# Patient Record
Sex: Male | Born: 1960
Health system: Southern US, Community
[De-identification: ages and names within clinical notes are randomized; demographics above are authoritative.]

## PROBLEM LIST (undated history)

## (undated) DIAGNOSIS — I4891 Unspecified atrial fibrillation: Secondary | ICD-10-CM

## (undated) DIAGNOSIS — M109 Gout, unspecified: Secondary | ICD-10-CM

## (undated) DIAGNOSIS — E785 Hyperlipidemia, unspecified: Secondary | ICD-10-CM

## (undated) HISTORY — PX: SEPTOPLASTY: SHX2393

## (undated) HISTORY — PX: INGUINAL HERNIA REPAIR: SUR1180

## (undated) HISTORY — PX: TONSILLECTOMY: SUR1361

## (undated) HISTORY — PX: SHOULDER ARTHROSCOPY: SHX128

---

## 2015-02-10 ENCOUNTER — Encounter (HOSPITAL_BASED_OUTPATIENT_CLINIC_OR_DEPARTMENT_OTHER): Payer: Self-pay | Admitting: *Deleted

## 2015-02-10 ENCOUNTER — Emergency Department (HOSPITAL_BASED_OUTPATIENT_CLINIC_OR_DEPARTMENT_OTHER)
Admission: EM | Admit: 2015-02-10 | Discharge: 2015-02-11 | Disposition: A | Discharge status: Home / Self Care | Payer: BLUE CROSS/BLUE SHIELD | Attending: Emergency Medicine | Admitting: Emergency Medicine

## 2015-02-10 DIAGNOSIS — I4891 Unspecified atrial fibrillation: Secondary | ICD-10-CM

## 2015-02-10 DIAGNOSIS — E785 Hyperlipidemia, unspecified: Secondary | ICD-10-CM | POA: Insufficient documentation

## 2015-02-10 DIAGNOSIS — I499 Cardiac arrhythmia, unspecified: Secondary | ICD-10-CM | POA: Diagnosis present

## 2015-02-10 DIAGNOSIS — Z7982 Long term (current) use of aspirin: Secondary | ICD-10-CM | POA: Insufficient documentation

## 2015-02-10 DIAGNOSIS — M109 Gout, unspecified: Secondary | ICD-10-CM | POA: Diagnosis not present

## 2015-02-10 DIAGNOSIS — Z79899 Other long term (current) drug therapy: Secondary | ICD-10-CM | POA: Insufficient documentation

## 2015-02-10 HISTORY — DX: Hyperlipidemia, unspecified: E78.5

## 2015-02-10 HISTORY — DX: Gout, unspecified: M10.9

## 2015-02-10 NOTE — ED Notes (Signed)
Pt states heart beating weird since 4pm. Has had episodes in past and states has never lasted this long. Complaining of dizziness and chest feeling funny but denies pain.

## 2015-02-10 NOTE — ED Notes (Signed)
Pt on heart monitor 

## 2015-02-11 LAB — BASIC METABOLIC PANEL
ANION GAP: 7 (ref 5–15)
BUN: 20 mg/dL (ref 6–20)
CHLORIDE: 106 mmol/L (ref 101–111)
CO2: 26 mmol/L (ref 22–32)
Calcium: 8.8 mg/dL — ABNORMAL LOW (ref 8.9–10.3)
Creatinine, Ser: 0.84 mg/dL (ref 0.61–1.24)
Glucose, Bld: 103 mg/dL — ABNORMAL HIGH (ref 65–99)
POTASSIUM: 3.7 mmol/L (ref 3.5–5.1)
SODIUM: 139 mmol/L (ref 135–145)

## 2015-02-11 LAB — CBC WITH DIFFERENTIAL/PLATELET
BASOS ABS: 0 10*3/uL (ref 0.0–0.1)
Basophils Relative: 0 %
EOS ABS: 0.1 10*3/uL (ref 0.0–0.7)
EOS PCT: 1 %
HCT: 44.9 % (ref 39.0–52.0)
HEMOGLOBIN: 15.6 g/dL (ref 13.0–17.0)
LYMPHS PCT: 27 %
Lymphs Abs: 1.8 10*3/uL (ref 0.7–4.0)
MCH: 31.5 pg (ref 26.0–34.0)
MCHC: 34.7 g/dL (ref 30.0–36.0)
MCV: 90.7 fL (ref 78.0–100.0)
Monocytes Absolute: 0.7 10*3/uL (ref 0.1–1.0)
Monocytes Relative: 11 %
NEUTROS PCT: 61 %
Neutro Abs: 4.2 10*3/uL (ref 1.7–7.7)
PLATELETS: 172 10*3/uL (ref 150–400)
RBC: 4.95 MIL/uL (ref 4.22–5.81)
RDW: 12.2 % (ref 11.5–15.5)
WBC: 6.9 10*3/uL (ref 4.0–10.5)

## 2015-02-11 LAB — TROPONIN I

## 2015-02-11 MED ORDER — APIXABAN 5 MG PO TABS: 5.0000 mg | ORAL_TABLET | Freq: Two times a day (BID) | ORAL | Status: DC

## 2015-02-11 NOTE — Discharge Instructions (Signed)

## 2015-02-11 NOTE — ED Provider Notes (Signed)
CSN: [id number]     Arrival date & time 02/10/15  2241 History   First MD Initiated Contact with Patient 02/11/15 0001     Chief Complaint  Patient presents with  . Irregular Heart Beat     (Consider location/radiation/quality/duration/timing/severity/associated sxs/prior Treatment) HPI  This is a 54 year old male who complains of an irregular heartbeat that began abruptly at about 4 PM. He has had episodes of irregular heartbeat in the past which all has resolved on their own. Those episodes stopped after he gave up caffeine. This is the first time he has had an episode that has persisted. He has at times felt like his heart was racing. He has also felt at times that he had several rapid beats in succession. He denies chest pain. He has some mild shortness of breath. He has had some lightheadedness.  Past Medical History  Diagnosis Date  . Hyperlipidemia   . Gout    Past Surgical History  Procedure Laterality Date  . Shoulder arthroscopy     No family history on file. Social History  Substance Use Topics  . Smoking status: Never Smoker   . Smokeless tobacco: None  . Alcohol Use: 1.2 oz/week    2 Glasses of wine per week     Comment: a few times a week    Review of Systems  All other systems reviewed and are negative.   Allergies  Review of patient's allergies indicates no known allergies.  Home Medications   Prior to Admission medications   Medication Sig Start Date End Date Taking? Authorizing Provider  allopurinol (ZYLOPRIM) 100 MG tablet Take 100 mg by mouth daily.   Yes Historical Provider, MD  aspirin 81 MG tablet Take 81 mg by mouth daily.   Yes Historical Provider, MD  atorvastatin (LIPITOR) 40 MG tablet Take 40 mg by mouth daily.   Yes Historical Provider, MD   BP 119/83 mmHg  Pulse 84  Temp(Src) 97.9 F (36.6 C) (Oral)  Resp 18  Ht 6\' 4"  (1.93 m)  Wt 223 lb (101.152 kg)  BMI 27.16 kg/m2   Physical Exam  General: Well-developed, well-nourished male in  no acute distress; appearance consistent with age of record HENT: normocephalic; atraumatic Eyes: pupils equal, round and reactive to light; extraocular muscles intact Neck: supple Heart: Irregularly irregular rhythm; atrial fibrillation on monitor; occasional PACs Lungs: clear to auscultation bilaterally Abdomen: soft; nondistended; nontender; no masses or hepatosplenomegaly; bowel sounds present Extremities: No deformity; full range of motion; pulses normal Neurologic: Awake, alert and oriented; motor function intact in all extremities and symmetric; no facial droop Skin: Warm and dry Psychiatric: Normal mood and affect    ED Course  Procedures (including critical care time)   EKG Interpretation   Date/Time:  Thursday February 10 2015 22:48:38 EST Ventricular Rate:  86 PR Interval:    QRS Duration: 76 QT Interval:  368 QTC Calculation: 440 R Axis:   64 Text Interpretation:  Atrial fibrillation Abnormal ECG No old tracing to  compare Confirmed by Dorothyann Mourer  MD, Jenny Reichmann ([id number]) on 02/11/2015 12:02:30 AM      MDM  Nursing notes and vitals signs, including pulse oximetry, reviewed.  Summary of this visit's results, reviewed by myself:  Labs:  Results for orders placed or performed during the hospital encounter of 02/10/15 (from the past 24 hour(s))  CBC with Differential/Platelet     Status: None   Collection Time: 02/11/15  1:05 AM  Result Value Ref Range   WBC 6.9 4.0 -  10.5 K/uL   RBC 4.95 4.22 - 5.81 MIL/uL   Hemoglobin 15.6 13.0 - 17.0 g/dL   HCT 44.9 39.0 - 52.0 %   MCV 90.7 78.0 - 100.0 fL   MCH 31.5 26.0 - 34.0 pg   MCHC 34.7 30.0 - 36.0 g/dL   RDW 12.2 11.5 - 15.5 %   Platelets 172 150 - 400 K/uL   Neutrophils Relative % 61 %   Neutro Abs 4.2 1.7 - 7.7 K/uL   Lymphocytes Relative 27 %   Lymphs Abs 1.8 0.7 - 4.0 K/uL   Monocytes Relative 11 %   Monocytes Absolute 0.7 0.1 - 1.0 K/uL   Eosinophils Relative 1 %   Eosinophils Absolute 0.1 0.0 - 0.7 K/uL    Basophils Relative 0 %   Basophils Absolute 0.0 0.0 - 0.1 K/uL  Basic metabolic panel     Status: Abnormal   Collection Time: 02/11/15  1:05 AM  Result Value Ref Range   Sodium 139 135 - 145 mmol/L   Potassium 3.7 3.5 - 5.1 mmol/L   Chloride 106 101 - 111 mmol/L   CO2 26 22 - 32 mmol/L   Glucose, Bld 103 (H) 65 - 99 mg/dL   BUN 20 6 - 20 mg/dL   Creatinine, Ser 0.84 0.61 - 1.24 mg/dL   Calcium 8.8 (L) 8.9 - 10.3 mg/dL   GFR calc non Af Amer >60 >60 mL/min   GFR calc Af Amer >60 >60 mL/min   Anion gap 7 5 - 15  Troponin I     Status: None   Collection Time: 02/11/15  1:05 AM  Result Value Ref Range   Troponin I <0.03 <0.031 ng/mL   1:59 AM Patient continues to be rate controlled. His care was discussed with Dr. Philbert Riser of cardiology. We will start him on Eliquis 5 milligrams twice daily and have him follow-up with cardiology as an outpatient.  Shanon Rosser, MD 02/11/15 [id number]

## 2015-02-11 NOTE — ED Notes (Signed)
Patient is alert and oriented x3.  He was given DC instructions and follow up visit instructions.  Patient gave verbal understanding.  He was DC ambulatory under his own power to home.  V/S stable.  He was not showing any signs of distress on DC 

## 2015-02-15 ENCOUNTER — Ambulatory Visit (INDEPENDENT_AMBULATORY_CARE_PROVIDER_SITE_OTHER): Payer: BLUE CROSS/BLUE SHIELD

## 2015-02-15 ENCOUNTER — Ambulatory Visit (INDEPENDENT_AMBULATORY_CARE_PROVIDER_SITE_OTHER): Payer: BLUE CROSS/BLUE SHIELD | Admitting: Cardiology

## 2015-02-15 ENCOUNTER — Encounter: Payer: Self-pay | Admitting: Cardiology

## 2015-02-15 VITALS — BP 106/78 | HR 88 | Ht 73.0 in | Wt 230.1 lb

## 2015-02-15 DIAGNOSIS — I4891 Unspecified atrial fibrillation: Secondary | ICD-10-CM | POA: Diagnosis not present

## 2015-02-15 DIAGNOSIS — I480 Paroxysmal atrial fibrillation: Secondary | ICD-10-CM | POA: Insufficient documentation

## 2015-02-15 NOTE — Progress Notes (Signed)
Cardiology Office Note   Date:  02/15/2015   ID:  John Hancock, DOB [birth year], MRN [id number]  PCP:  Haywood Pao, MD  Cardiologist:   Minus Breeding, MD   Chief Complaint  Patient presents with  . Atrial Fibrillation      History of Present Illness: John Hancock is a 54 y.o. male who presents for evaluation of atrial fibrillation. He was in the emergency room on 12/1 with an episode of palpitations and was found to be in atrial fibrillation. He was started on Eliquis.  His rate was reasonably controlled.  He has had palpitations off and on for years and he thinks are similar but has never been found to be fibrillation. He is retiring soon and has been to some retirement parties and was drinking more alcohol the night before and he does typically.  However, since being home he's had some palpitations. He seems to notice this more at night. They seem to be skipping beats. It's not as severe as his fibrillation. He's not having any presyncope or syncope. He denies any chest pressure, neck or arm discomfort. He has no shortness of breath, PND or orthopnea. He's physically active and exercises routinely. He has had negative stress tests over the years with his last stress test 2 years ago during which she was able to exercise for about 12 minutes. With all of this he gets no symptoms.    Past Medical History  Diagnosis Date  . Hyperlipidemia   . Gout     Past Surgical History  Procedure Laterality Date  . Shoulder arthroscopy    . Septoplasty    . Tonsillectomy    . Inguinal hernia repair Bilateral      Current Outpatient Prescriptions  Medication Sig Dispense Refill  . allopurinol (ZYLOPRIM) 300 MG tablet Take 1 tablet by mouth daily. Take 1 tab daily    . amoxicillin-clavulanate (AUGMENTIN) 875-125 MG tablet Take 1 tablet by mouth 2 (two) times daily. Take 1 tab twice a day    . aspirin 81 MG tablet Take 81 mg by mouth daily.    Marland Kitchen atorvastatin (LIPITOR) 10 MG tablet  Take 1 tablet by mouth daily. Take 1 tab daily    . benzonatate (TESSALON) 100 MG capsule Take 1 capsule by mouth 3 (three) times daily. Take 1 perle tid    . Glucosamine-Chondroit-Vit C-Mn (GLUCOSAMINE CHONDR 1500 COMPLX) CAPS Take 1 capsule by mouth daily.    . Probiotic Product (PROBIOTIC DAILY PO) Take 1 tablet by mouth daily.     No current facility-administered medications for this visit.    Allergies:   Review of patient's allergies indicates no known allergies.    Social History:  The patient  reports that he has never smoked. He has never used smokeless tobacco. He reports that he drinks about 1.8 oz of alcohol per week. He reports that he does not use illicit drugs.   Family History:  The patient's family history includes CAD (age of onset: 79) in his father; Diabetes in his father, maternal grandfather, maternal grandmother, paternal grandfather, and paternal grandmother; Fainting in his maternal grandfather and paternal grandmother; Heart attack in his maternal grandfather and paternal grandfather; Hypertension in his brother, father, maternal grandfather, maternal grandmother, mother, paternal grandfather, and paternal grandmother.    ROS:  Please see the history of present illness.   Otherwise, review of systems are positive for none.   All other systems are reviewed and negative.    PHYSICAL  EXAM: VS:  BP 106/78 mmHg  Pulse 88  Ht 6\' 1"  (1.854 m)  Wt 230 lb 1 oz (104.356 kg)  BMI 30.36 kg/m2 , BMI Body mass index is 30.36 kg/(m^2). GENERAL:  Well appearing HEENT:  Pupils equal round and reactive, fundi not visualized, oral mucosa unremarkable NECK:  No jugular venous distention, waveform within normal limits, carotid upstroke brisk and symmetric, no bruits, no thyromegaly LYMPHATICS:  No cervical, inguinal adenopathy LUNGS:  Clear to auscultation bilaterally BACK:  No CVA tenderness CHEST:  Unremarkable HEART:  PMI not displaced or sustained,S1 and S2 within normal  limits, no S3, no S4, no clicks, no rubs, no murmurs ABD:  Flat, positive bowel sounds normal in frequency in pitch, no bruits, no rebound, no guarding, no midline pulsatile mass, no hepatomegaly, no splenomegaly EXT:  2 plus pulses throughout, no edema, no cyanosis no clubbing SKIN:  No rashes no nodules NEURO:  Cranial nerves II through XII grossly intact, motor grossly intact throughout PSYCH:  Cognitively intact, oriented to person place and time    EKG:  EKG is ordered today. The ekg ordered today demonstrates Sinus rhythm, rate 87, axis within normal limits, intervals within normal limits, no acute ST-T wave changes.   Recent Labs: 02/11/2015: BUN 20; Creatinine, Ser 0.84; Hemoglobin 15.6; Platelets 172; Potassium 3.7; Sodium 139    Lipid Panel No results found for: CHOL, TRIG, HDL, CHOLHDL, VLDL, LDLCALC, LDLDIRECT    Wt Readings from Last 3 Encounters:  02/15/15 230 lb 1 oz (104.356 kg)  02/10/15 223 lb (101.152 kg)      Other studies Reviewed: Additional studies/ records that were reviewed today include: ED records. Review of the above records demonstrates:  Please see elsewhere in the note.     ASSESSMENT AND PLAN:  ATRIAL FIB:  The patient is currently in sinus rhythm. He's having palpitations that may or may not be atrial fibrillation. I'm going check a TSH and an echocardiogram. I'm going to get an event monitor. Kielan Fisk will need a 21 day event monitor.  The patients symptoms necessitate an event monitor.  The symptoms are too infrequent to be identified on a Holter monitor.  If he does have atrial fibrillation I would likely use flecainide as he symptomatic. However, I suspect he is actually describing premature ectopic complexes. Of note his documented fibrillation could've been related to increased alcohol the day before. Of note his Mr. Fawkes Pivarnik has a CHA2DS2 - VASc score of 0 and does not need systemic anticoagulation.    Current medicines are reviewed  at length with the patient today.  The patient does not have concerns regarding medicines.  The following changes have been made:  As above  Labs/ tests ordered today include:   Orders Placed This Encounter  Procedures  . TSH  . Cardiac event monitor  . EKG 12-Lead  . ECHOCARDIOGRAM COMPLETE     Disposition:   FU with me after the event monitor.      Signed, Minus Breeding, MD  02/15/2015 5:08 PM    [place]

## 2015-02-15 NOTE — Patient Instructions (Signed)
Your physician has recommended you make the following change in your medication:   STOP ELIQUIS  CONTINUE ASPIRIN [address] [place] physician recommends that you return for lab work in: [place] has recommended that you wear a 21 DAY event monitor. Event monitors are medical devices that record the heart's electrical activity. Doctors most often [place] these monitors to diagnose arrhythmias. Arrhythmias are problems with the speed or rhythm of the heartbeat. The monitor is a small, portable device. You can wear one while you do your normal daily activities. This is usually used to diagnose what is causing palpitations/syncope (passing out).  Your physician has requested that you have an echocardiogram. Echocardiography is a painless test that uses sound waves to create images of your heart. It provides your doctor with information about the size and shape of your heart and how well your heart's chambers and valves are working. This procedure takes approximately one hour. There are no restrictions for this procedure.  Your physician recommends that you schedule a follow-up appointment in: [place] DR. HOCHREIN

## 2015-02-16 ENCOUNTER — Other Ambulatory Visit: Payer: Self-pay

## 2015-02-16 ENCOUNTER — Ambulatory Visit (HOSPITAL_COMMUNITY): Payer: BLUE CROSS/BLUE SHIELD | Attending: Cardiovascular Disease

## 2015-02-16 DIAGNOSIS — I4891 Unspecified atrial fibrillation: Secondary | ICD-10-CM | POA: Insufficient documentation

## 2015-02-16 LAB — TSH: TSH: 1.1 u[IU]/mL (ref 0.350–4.500)

## 2015-02-17 ENCOUNTER — Telehealth: Payer: Self-pay | Admitting: Cardiology

## 2015-02-17 NOTE — Telephone Encounter (Signed)
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  Fax incoming

## 2015-02-18 ENCOUNTER — Ambulatory Visit: Payer: BLUE CROSS/BLUE SHIELD | Admitting: Cardiology

## 2015-02-18 NOTE — Telephone Encounter (Signed)
Fax was not received. Discussed w/ Dr. Percival Spanish yesterday, strip printed from [place] website. No concerns. Pt on anticoagulation and recently seen for dx of A Fib out of ER.

## 2015-02-19 ENCOUNTER — Telehealth: Payer: Self-pay | Admitting: Cardiology

## 2015-02-19 NOTE — Telephone Encounter (Signed)
The patient did have atrial fib noted on his monitor.  I would like for him to come in for a POET (Plain Old Exercise Treadmill).  I will need to discuss with him a Flecainide pill in pocket approach after he has the POET (Plain Old Exercise Treadmill).

## 2015-02-21 ENCOUNTER — Telehealth: Payer: Self-pay | Admitting: *Deleted

## 2015-02-21 DIAGNOSIS — I4891 Unspecified atrial fibrillation: Secondary | ICD-10-CM

## 2015-02-21 NOTE — Telephone Encounter (Signed)
-----   Message from Minus Breeding, MD sent at 02/19/2015  1:48 AM EST ----- Please call him and arrange a POET (Plain Old Exercise Treadmill).  He had atrial fib.  I might be starting Flecainide.

## 2015-02-22 ENCOUNTER — Telehealth: Payer: Self-pay | Admitting: Cardiology

## 2015-02-22 NOTE — Telephone Encounter (Signed)
Returning your call from Monday.Please call on Wednesday after 10:00.

## 2015-02-28 ENCOUNTER — Encounter: Payer: Self-pay | Admitting: Cardiology

## 2015-03-31 ENCOUNTER — Telehealth (HOSPITAL_COMMUNITY): Payer: Self-pay

## 2015-03-31 NOTE — Telephone Encounter (Signed)
Encounter complete. 

## 2015-04-04 ENCOUNTER — Ambulatory Visit: Payer: BLUE CROSS/BLUE SHIELD | Admitting: Cardiology

## 2015-04-05 ENCOUNTER — Ambulatory Visit (HOSPITAL_COMMUNITY)
Admission: RE | Admit: 2015-04-05 | Discharge: 2015-04-05 | Disposition: A | Discharge status: Home / Self Care | Payer: BLUE CROSS/BLUE SHIELD | Source: Ambulatory Visit | Attending: Cardiology | Admitting: Cardiology

## 2015-04-05 ENCOUNTER — Encounter: Payer: Self-pay | Admitting: Cardiology

## 2015-04-05 ENCOUNTER — Ambulatory Visit (INDEPENDENT_AMBULATORY_CARE_PROVIDER_SITE_OTHER): Payer: BLUE CROSS/BLUE SHIELD | Admitting: Cardiology

## 2015-04-05 VITALS — BP 100/78 | HR 78 | Ht 76.0 in | Wt 223.3 lb

## 2015-04-05 DIAGNOSIS — I4891 Unspecified atrial fibrillation: Secondary | ICD-10-CM | POA: Insufficient documentation

## 2015-04-05 DIAGNOSIS — I480 Paroxysmal atrial fibrillation: Secondary | ICD-10-CM

## 2015-04-05 LAB — EXERCISE TOLERANCE TEST
CHL CUP MPHR: 139 {beats}/min
CHL CUP RESTING HR STRESS: 64 {beats}/min
CSEPED: 14 min
CSEPHR: 128 %
Estimated workload: 17.3 METS
Exercise duration (sec): 15 s
Peak HR: 210 {beats}/min
RPE: 17

## 2015-04-05 MED ORDER — METOPROLOL SUCCINATE ER 25 MG PO TB24: 25.0000 mg | ORAL_TABLET | Freq: Every day | ORAL | Status: DC

## 2015-04-05 MED ORDER — FLECAINIDE ACETATE 100 MG PO TABS: 100.0000 mg | ORAL_TABLET | Freq: Two times a day (BID) | ORAL | Status: DC

## 2015-04-05 NOTE — Patient Instructions (Signed)
Dr Percival Spanish has recommended making the following medication changes: START Metoprolol Succinate (ToprolXL) 25 mg TODAY - take 1 tablet by mouth daily IN 3 DAYS (FRIDAY) START Flecainide 100 mg - For 3 days, take 0.5 tablet (50 mg total) by mouth twice daily, then increase to 1 tablet (100 mg total) by mouth twice daily.  >New prescriptions have been sent to the pharmacy on file electronically  Your physician recommends that you schedule a follow-up appointment in 1 month.  If you need a refill on your cardiac medications before your next appointment, please call your pharmacy.

## 2015-04-05 NOTE — Progress Notes (Signed)
Cardiology Office Note   Date:  04/05/2015   ID:  John Hancock, DOB [birth year], MRN [id number]  PCP:  Haywood Pao, MD  Cardiologist:   Minus Breeding, MD   Chief Complaint  Patient presents with  . Atrial Fibrillation      History of Present Illness: John Hancock is a 55 y.o. male who presents for evaluation of atrial fibrillation. He was in the emergency room on 12/1 with an episode of palpitations and was found to be in atrial fibrillation. He was started on Eliquis.  His rate was reasonably controlled.  He has had palpitations off and on for years.   I did put an event monitor on him and he did have paroxymal atrial fibrillation. Today he returns for a POET (Plain Old Exercise Treadmill)  which did demonstrate atrial fibrillation while exercising.  He went back into sinus rhythm in recovery.  There was no evidence of ischemia. He does notice some palpitations. He thinks that he notices when he is in fib and he has been doing this paroxysmally.  He otherwise denies any symptoms.  The patient denies any  chest discomfort, neck or arm discomfort. There has been no new shortness of breath, PND or orthopnea. There has been no presyncope or syncope.  Past Medical History  Diagnosis Date  . Hyperlipidemia   . Gout     Past Surgical History  Procedure Laterality Date  . Shoulder arthroscopy    . Septoplasty    . Tonsillectomy    . Inguinal hernia repair Bilateral      Current Outpatient Prescriptions  Medication Sig Dispense Refill  . allopurinol (ZYLOPRIM) 300 MG tablet Take 1 tablet by mouth daily. Take 1 tab daily    . aspirin 81 MG tablet Take 81 mg by mouth daily.    Marland Kitchen atorvastatin (LIPITOR) 10 MG tablet Take 1 tablet by mouth daily. Take 1 tab daily    . Glucosamine-Chondroit-Vit C-Mn (GLUCOSAMINE CHONDR 1500 COMPLX) CAPS Take 1 capsule by mouth daily.    . Probiotic Product (PROBIOTIC DAILY PO) Take 1 tablet by mouth daily.    . flecainide (TAMBOCOR) 100 MG  tablet Take 1 tablet (100 mg total) by mouth 2 (two) times daily. 60 tablet 11  . metoprolol succinate (TOPROL-XL) 25 MG 24 hr tablet Take 1 tablet (25 mg total) by mouth daily. 30 tablet 11   No current facility-administered medications for this visit.    Allergies:   Review of patient's allergies indicates no known allergies.       ROS:  Please see the history of present illness.   Otherwise, review of systems are positive for none.   All other systems are reviewed and negative.    PHYSICAL EXAM: VS:  BP 100/78 mmHg  Pulse 78  Ht 6\' 4"  (1.93 m)  Wt 223 lb 4.8 oz (101.288 kg)  BMI 27.19 kg/m2 , BMI Body mass index is 27.19 kg/(m^2). GENERAL:  Well appearing NECK:  No jugular venous distention, waveform within normal limits, carotid upstroke brisk and symmetric, no bruits, no thyromegaly LYMPHATICS:  No cervical, inguinal adenopathy LUNGS:  Clear to auscultation bilaterally BACK:  No CVA tenderness CHEST:  Unremarkable HEART:  PMI not displaced or sustained,S1 and S2 within normal limits, no S3, no S4, no clicks, no rubs, no murmurs ABD:  Flat, positive bowel sounds normal in frequency in pitch, no bruits, no rebound, no guarding, no midline pulsatile mass, no hepatomegaly, no splenomegaly EXT:  2 plus pulses  throughout, no edema, no cyanosis no clubbing SKIN:  No rashes no nodules    EKG:  EKG is not ordered today.  Recent Labs: 02/11/2015: BUN 20; Creatinine, Ser 0.84; Hemoglobin 15.6; Platelets 172; Potassium 3.7; Sodium 139 02/15/2015: TSH 1.100    Lipid Panel No results found for: CHOL, TRIG, HDL, CHOLHDL, VLDL, LDLCALC, LDLDIRECT    Wt Readings from Last 3 Encounters:  04/05/15 223 lb 4.8 oz (101.288 kg)  02/15/15 230 lb 1 oz (104.356 kg)  02/10/15 223 lb (101.152 kg)      Other studies Reviewed: Additional studies/ records that were reviewed today include:   POET (Plain Old Exercise Treadmill) Review of the above records demonstrates:  Please see elsewhere  in the note.     ASSESSMENT AND PLAN:  ATRIAL FIB:  The patient is having symptomatic PAF.  I will start a low dose of beta blocker and then titrate up flecainide to 100 mg bid.  He will come back in one month and if he is having fewer paroxysms I will plan to get a drug level and repeat a POET (Plain Old Exercise Treadmill) to make sure that there is no pro arrhythmia.     Current medicines are reviewed at length with the patient today.  The patient does not have concerns regarding medicines.  The following changes have been made:  As above  Labs/ tests ordered today include:   No orders of the defined types were placed in this encounter.     Disposition:   FU with me in one month.      Signed, Minus Breeding, MD  04/05/2015 1:39 PM    [facility]

## 2015-04-29 ENCOUNTER — Ambulatory Visit: Payer: BLUE CROSS/BLUE SHIELD | Admitting: Cardiology

## 2015-05-01 NOTE — Progress Notes (Signed)
Cardiology Office Note   Date:  05/02/2015   ID:  John Hancock, DOB 1960-08-13, MRN [id number]  PCP:  Haywood Pao, MD  Cardiologist:   Minus Breeding, MD   No chief complaint on file.     History of Present Illness: John Hancock is a 55 y.o. male who presents for evaluation of atrial fibrillation. He was in the emergency room on 12/1 with an episode of palpitations and was found to be in atrial fibrillation. He was started on Eliquis.  His rate was reasonably controlled.  He has had palpitations off and on for years.   I did put an event monitor on him and he did have paroxymal atrial fibrillation. A POET (Plain Old Exercise Treadmill)  did demonstrate atrial fibrillation while exercising.  He went back into sinus rhythm in recovery.  There was no evidence of ischemia.   He was started on flecainide.  Since then he has had no further palpitations.  He exercises and does not have any symptoms.  The patient denies any new symptoms such as chest discomfort, neck or arm discomfort. There has been no new shortness of breath, PND or orthopnea. There have been no reported palpitations, presyncope or syncope.   Past Medical History  Diagnosis Date  . Hyperlipidemia   . Gout     Past Surgical History  Procedure Laterality Date  . Shoulder arthroscopy    . Septoplasty    . Tonsillectomy    . Inguinal hernia repair Bilateral      Current Outpatient Prescriptions  Medication Sig Dispense Refill  . allopurinol (ZYLOPRIM) 300 MG tablet Take 1 tablet by mouth daily. Take 1 tab daily    . aspirin 81 MG tablet Take 81 mg by mouth daily.    Marland Kitchen atorvastatin (LIPITOR) 10 MG tablet Take 1 tablet by mouth daily. Take 1 tab daily    . flecainide (TAMBOCOR) 100 MG tablet Take 1 tablet (100 mg total) by mouth 2 (two) times daily. 60 tablet 11  . Glucosamine-Chondroit-Vit C-Mn (GLUCOSAMINE CHONDR 1500 COMPLX) CAPS Take 1 capsule by mouth daily.    . metoprolol succinate (TOPROL-XL) 25 MG 24  hr tablet Take 1 tablet (25 mg total) by mouth daily. 30 tablet 11  . Probiotic Product (PROBIOTIC DAILY PO) Take 1 tablet by mouth daily.     No current facility-administered medications for this visit.    Allergies:   Review of patient's allergies indicates no known allergies.       ROS:  Please see the history of present illness.   Otherwise, review of systems are positive for none.   All other systems are reviewed and negative.    PHYSICAL EXAM: VS:  BP 104/80 mmHg  Pulse 76  Ht 6\' 4"  (1.93 m)  Wt 230 lb 4 oz (104.441 kg)  BMI 28.04 kg/m2 , BMI Body mass index is 28.04 kg/(m^2). GENERAL:  Well appearing NECK:  No jugular venous distention, waveform within normal limits, carotid upstroke brisk and symmetric, no bruits, no thyromegaly LYMPHATICS:  No cervical, inguinal adenopathy LUNGS:  Clear to auscultation bilaterally BACK:  No CVA tenderness CHEST:  Unremarkable HEART:  PMI not displaced or sustained,S1 and S2 within normal limits, no S3, no S4, no clicks, no rubs, no murmurs ABD:  Flat, positive bowel sounds normal in frequency in pitch, no bruits, no rebound, no guarding, no midline pulsatile mass, no hepatomegaly, no splenomegaly EXT:  2 plus pulses throughout, no edema, no cyanosis no clubbing SKIN:  No rashes no nodules    EKG:  EKG is not ordered today.  Recent Labs: 02/11/2015: BUN 20; Creatinine, Ser 0.84; Hemoglobin 15.6; Platelets 172; Potassium 3.7; Sodium 139 02/15/2015: TSH 1.100    Lipid Panel No results found for: CHOL, TRIG, HDL, CHOLHDL, VLDL, LDLCALC, LDLDIRECT    Wt Readings from Last 3 Encounters:  05/02/15 230 lb 4 oz (104.441 kg)  04/05/15 223 lb 4.8 oz (101.288 kg)  02/15/15 230 lb 1 oz (104.356 kg)      Other studies Reviewed: Additional studies/ records that were reviewed today include:   POET (Plain Old Exercise Treadmill) Review of the above records demonstrates:  Please see elsewhere in the note.     ASSESSMENT AND  PLAN:  ATRIAL FIB:  Mr. [name] has a CHA2DS2 - VASc score of 0.  He will remain on the current meds and come back for a trough flecainide level and POET (Plain Old Exercise Treadmill) on drug  Current medicines are reviewed at length with the patient today.  The patient does not have concerns regarding medicines.  The following changes have been made:  None  Labs/ tests ordered today include:   Orders Placed This Encounter  Procedures  . Flecainide level  . Exercise Tolerance Test     Disposition:   FU with me in one year   Signed, Minus Breeding, MD  05/02/2015 8:23 AM    [place]

## 2015-05-02 ENCOUNTER — Encounter: Payer: Self-pay | Admitting: Cardiology

## 2015-05-02 ENCOUNTER — Ambulatory Visit: Payer: BLUE CROSS/BLUE SHIELD | Admitting: Cardiology

## 2015-05-02 ENCOUNTER — Ambulatory Visit (INDEPENDENT_AMBULATORY_CARE_PROVIDER_SITE_OTHER): Payer: BLUE CROSS/BLUE SHIELD | Admitting: Cardiology

## 2015-05-02 VITALS — BP 104/80 | HR 76 | Ht 76.0 in | Wt 230.2 lb

## 2015-05-02 DIAGNOSIS — I4891 Unspecified atrial fibrillation: Secondary | ICD-10-CM

## 2015-05-02 MED ORDER — METOPROLOL SUCCINATE ER 25 MG PO TB24: 25.0000 mg | ORAL_TABLET | Freq: Every day | ORAL | Status: DC

## 2015-05-02 MED ORDER — FLECAINIDE ACETATE 100 MG PO TABS: 100.0000 mg | ORAL_TABLET | Freq: Two times a day (BID) | ORAL | Status: DC

## 2015-05-02 NOTE — Patient Instructions (Signed)
Medication Instructions:   NO CHANGE  Labwork:  Your physician recommends that you return for lab work PRIOR TO TAKING FLECAINIDE MORNING DOSE  Testing/Procedures:  Your physician has requested that you have an exercise tolerance test. For further information please visit [email or web address]. Please also follow instruction sheet, as given.    Follow-Up:  Your physician wants you to follow-up in: [place] will receive a reminder letter in the mail two months in advance. If you don't receive a letter, please call our office to schedule the follow-up appointment.   If you need a refill on your cardiac medications before your next appointment, please call your pharmacy.    Exercise Stress Electrocardiogram An exercise stress electrocardiogram is a test to check how blood flows to your heart. It is done to find areas of poor blood flow. You will need to walk on a treadmill for this test. The electrocardiogram will record your heartbeat when you are at rest and when you are exercising. BEFORE THE PROCEDURE  Do not have drinks with caffeine or foods with caffeine for 24 hours before the test, or as told by your doctor. This includes coffee, tea (even decaf tea), sodas, chocolate, and cocoa.  Follow your doctor's instructions about eating and drinking before the test.  Ask your doctor what medicines you should or should not take before the test. Take your medicines with water unless told by your doctor not to.  If you use an inhaler, bring it with you to the test.  Bring a snack to eat after the test.  Do not  smoke for 4 hours before the test.  Do not put lotions, powders, creams, or oils on your chest before the test.  Wear comfortable shoes and clothing. PROCEDURE  You will have patches put on your chest. Small areas of your chest may need to be shaved. Wires will be connected to the patches.  Your heart rate will be watched while you are resting and while  you are exercising.  You will walk on the treadmill. The treadmill will slowly get faster to raise your heart rate.  The test will take about 1-2 hours. AFTER THE PROCEDURE  Your heart rate and blood pressure will be watched after the test.  You may return to your normal diet, activities, and medicines or as told by your doctor.   This information is not intended to replace advice given to you by your health care provider. Make sure you discuss any questions you have with your health care provider.   Document Released: 08/15/2007 Document Revised: 03/19/2014 Document Reviewed: 11/03/2012 Elsevier Interactive Patient Education [employer].

## 2015-05-06 ENCOUNTER — Ambulatory Visit: Payer: BLUE CROSS/BLUE SHIELD | Admitting: Cardiology

## 2015-05-27 ENCOUNTER — Telehealth (HOSPITAL_COMMUNITY): Payer: Self-pay

## 2015-05-27 NOTE — Telephone Encounter (Signed)
Encounter complete. 

## 2015-05-31 ENCOUNTER — Other Ambulatory Visit: Payer: Self-pay | Admitting: *Deleted

## 2015-05-31 ENCOUNTER — Ambulatory Visit (HOSPITAL_COMMUNITY)
Admission: RE | Admit: 2015-05-31 | Discharge: 2015-05-31 | Disposition: A | Discharge status: Home / Self Care | Payer: BLUE CROSS/BLUE SHIELD | Source: Ambulatory Visit | Attending: Cardiovascular Disease | Admitting: Cardiovascular Disease

## 2015-05-31 DIAGNOSIS — I4891 Unspecified atrial fibrillation: Secondary | ICD-10-CM | POA: Diagnosis not present

## 2015-05-31 LAB — EXERCISE TOLERANCE TEST
CHL CUP RESTING HR STRESS: 60 {beats}/min
CHL RATE OF PERCEIVED EXERTION: 15
CSEPED: 13 min
Estimated workload: 15.1 METS
MPHR: 163 {beats}/min
Peak HR: 136 {beats}/min
Percent HR: 83 %

## 2015-05-31 MED ORDER — FLECAINIDE ACETATE 100 MG PO TABS: 100.0000 mg | ORAL_TABLET | Freq: Two times a day (BID) | ORAL | Status: DC

## 2015-05-31 MED ORDER — METOPROLOL SUCCINATE ER 25 MG PO TB24: 25.0000 mg | ORAL_TABLET | Freq: Every day | ORAL | Status: DC

## 2015-05-31 NOTE — Telephone Encounter (Signed)
Refills sent to pharmacy following pt walk-in request.

## 2015-06-03 ENCOUNTER — Other Ambulatory Visit: Payer: Self-pay

## 2015-06-03 MED ORDER — METOPROLOL SUCCINATE ER 25 MG PO TB24: 25.0000 mg | ORAL_TABLET | Freq: Every day | ORAL | Status: AC

## 2015-06-06 ENCOUNTER — Other Ambulatory Visit: Payer: Self-pay | Admitting: *Deleted

## 2015-06-07 LAB — FLECAINIDE LEVEL: Flecainide: 0.2 ug/mL (ref 0.20–1.00)

## 2015-06-08 ENCOUNTER — Other Ambulatory Visit: Payer: Self-pay | Admitting: *Deleted

## 2015-06-08 MED ORDER — FLECAINIDE ACETATE 100 MG PO TABS: 100.0000 mg | ORAL_TABLET | Freq: Two times a day (BID) | ORAL | Status: DC

## 2015-06-09 ENCOUNTER — Telehealth: Payer: Self-pay | Admitting: Cardiology

## 2015-06-09 NOTE — Telephone Encounter (Signed)
Left message to call back  

## 2015-06-09 NOTE — Telephone Encounter (Signed)
Returning your call from earlier this week.He thinks it might have been his test results.

## 2015-06-09 NOTE — Telephone Encounter (Signed)
Leave message for pt to call back 

## 2015-06-09 NOTE — Telephone Encounter (Signed)
F/u   Pt returning RN phone call- test results. Please call back and discuss.

## 2015-06-10 ENCOUNTER — Telehealth: Payer: Self-pay | Admitting: *Deleted

## 2015-06-10 NOTE — Telephone Encounter (Signed)
Spoke to patient.  LAB ( FLECAINIDE) Result given . Verbalized understanding

## 2015-06-10 NOTE — Telephone Encounter (Signed)
Spoke to patient.  POET--Result given . Verbalized understanding

## 2015-06-10 NOTE — Telephone Encounter (Signed)
°  Follow Up   Pt returing call regarding test results. Please call.

## 2015-08-03 ENCOUNTER — Encounter (HOSPITAL_COMMUNITY): Payer: Self-pay | Admitting: Nurse Practitioner

## 2015-08-03 ENCOUNTER — Telehealth: Payer: Self-pay | Admitting: Cardiology

## 2015-08-03 ENCOUNTER — Ambulatory Visit (HOSPITAL_COMMUNITY)
Admission: RE | Admit: 2015-08-03 | Discharge: 2015-08-03 | Disposition: A | Discharge status: Home / Self Care | Payer: BLUE CROSS/BLUE SHIELD | Source: Ambulatory Visit | Attending: Nurse Practitioner | Admitting: Nurse Practitioner

## 2015-08-03 VITALS — BP 118/62 | HR 59 | Ht 76.0 in | Wt 226.0 lb

## 2015-08-03 DIAGNOSIS — I480 Paroxysmal atrial fibrillation: Secondary | ICD-10-CM | POA: Diagnosis not present

## 2015-08-03 DIAGNOSIS — I4891 Unspecified atrial fibrillation: Secondary | ICD-10-CM | POA: Insufficient documentation

## 2015-08-03 DIAGNOSIS — R002 Palpitations: Secondary | ICD-10-CM | POA: Diagnosis not present

## 2015-08-03 NOTE — Telephone Encounter (Signed)
Pt states for past 3-4 days he's been having palpitations. Currently on flec and metoprolol.  Pt states he hasn't checked rate but can feel his heart beating irregularly. States it's not fast. He is not having any SOB. States lightheadedness when standing quickly. BP normally runs [id number] systolic, he has not checked at home today but does have BP cuff. In absence of VS readings, I did not advise extra metoprolol at this time.  I spoke to [facility] at [facility] Fib clinic and gave recount of information given by patient. She conferred w/ Butch Penny who requested to see pt in AF clinic today at 3:30pm.  Called pt back and gave him appt information, including driving directions, gate code, and contact info for clinic.

## 2015-08-03 NOTE — Telephone Encounter (Signed)
Patient c/o Palpitations:  High priority if patient c/o lightheadedness and shortness of breath.  1. How long have you been having palpitations? 3DYAS 2. Are you currently experiencing lightheadedness and shortness of breath? John Hancock  3. Have you checked your BP and heart rate? (document readings) NO  4. Are you experiencing any other symptoms? NO

## 2015-08-03 NOTE — Patient Instructions (Signed)
May take extra 1/2 tablet of metoprolol with afib episode if heart rate is greater than 100 as long as blood pressure is greater than 100.  Record blood pressure/heart rate with afib episodes. Bring log with you to return visit.

## 2015-08-04 ENCOUNTER — Encounter (HOSPITAL_COMMUNITY): Payer: Self-pay | Admitting: Nurse Practitioner

## 2015-08-04 NOTE — Progress Notes (Signed)
Patient ID: John Hancock, male   DOB: [birth year], 55 y.o.   MRN: [id number]    Primary Care Physician: Haywood Pao, MD Referring Physician: Dr. [name] is a 55 y.o. male with a h/o afib, new onset 02/2015, although admits to episodes of palpitations for years.. He was seen by Dr. Percival Spanish  and was placed on flecainide 100 mg bid and on f/u 04/2015 was doing well without any further afib. He is being seen in the afib clinic today due to call into [facility] triage nurse, c/o palpitations for several weeks and is being seen in the afib clinic.  He is now in [place]. He reports no significant use of alcohol or caffeine. 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 He denies any snoring history. He is on asa with a chadsvasc score of 0.   Today, he denies symptoms of palpitations, chest pain, shortness of breath, orthopnea, PND, lower extremity edema, dizziness, presyncope, syncope, or neurologic sequela. The patient is tolerating medications without difficulties and is otherwise without complaint today.   Past Medical History  Diagnosis Date  . Hyperlipidemia   . Gout    Past Surgical History  Procedure Laterality Date  . Shoulder arthroscopy    . Septoplasty    . Tonsillectomy    . Inguinal hernia repair Bilateral     Current Outpatient Prescriptions  Medication Sig Dispense Refill  . allopurinol (ZYLOPRIM) 300 MG tablet Take 1 tablet by mouth daily. Take 1 tab daily    . aspirin 81 MG tablet Take 81 mg by mouth daily.    Marland Kitchen atorvastatin (LIPITOR) 10 MG tablet Take 1 tablet by mouth daily. Take 1 tab daily    . flecainide (TAMBOCOR) 100 MG tablet Take 1 tablet (100 mg total) by mouth 2 (two) times daily. 180 tablet 3  . Glucosamine-Chondroit-Vit C-Mn (GLUCOSAMINE CHONDR 1500 COMPLX) CAPS Take 1 capsule by mouth daily.    . metoprolol succinate  (TOPROL-XL) 25 MG 24 hr tablet Take 1 tablet (25 mg total) by mouth daily. 90 tablet 3  . Probiotic Product (PROBIOTIC DAILY PO) Take 1 tablet by mouth daily.     No current facility-administered medications for this encounter.    No Known Allergies  Social History   Social History  . Marital Status: Married    Spouse Name: N/A  . Number of Children: 2  . Years of Education: N/A   Occupational History  . Not on file.   Social History Main Topics  . Smoking status: Never Smoker   . Smokeless tobacco: Never Used  . Alcohol Use: 1.8 oz/week    3 Glasses of wine per week     Comment: a few times a week  . Drug Use: No  . Sexual Activity:    Partners: Female     Comment: married   Other Topics Concern  . Not on file   Social History Narrative   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      Family History  Problem Relation Age of Onset  . Diabetes Father   . Diabetes Maternal Grandmother   . Diabetes Maternal Grandfather   . Diabetes Paternal Grandmother   . Diabetes Paternal Grandfather   . CAD Father 14    CABG, arrhythmia with ablation (no details)   . Heart attack Maternal  Grandfather   . Heart attack Paternal Grandfather   . Hypertension Mother   . Hypertension Father   . Hypertension Brother   . Hypertension Maternal Grandmother   . Hypertension Maternal Grandfather   . Hypertension Paternal Grandmother   . Hypertension Paternal Grandfather   . Fainting Maternal Grandfather   . Fainting Paternal Grandmother     ROS- All systems are reviewed and negative except as per the HPI above  Physical Exam: Filed Vitals:   08/03/15 1524  BP: 118/62  Pulse: 59  Height: 6\' 4"  (1.93 m)  Weight: 226 lb (102.513 kg)    GEN- The patient is well appearing, alert and oriented x 3 today.   Head- normocephalic, atraumatic Eyes-  Sclera clear, conjunctiva pink Ears- hearing intact Oropharynx- clear Neck- supple, no JVP Lymph- no cervical lymphadenopathy Lungs- Clear  to ausculation bilaterally, normal work of breathing Heart- Regular rate and rhythm, no murmurs, rubs or gallops, PMI not laterally displaced GI- soft, NT, ND, + BS Extremities- no clubbing, cyanosis, or edema MS- no significant deformity or atrophy Skin- no rash or lesion Psych- euthymic mood, full affect Neuro- strength and sensation are intact  EKG- Sinus brady at 59 bpm,  Pr int 178 ms, qrs it 88 ms, qtc 415 ms Epic records reviewed Echo 12/16- Left ventricle: The cavity size was normal. Wall thickness was  normal. Systolic function was normal. The estimated ejection  fraction was in the range of 55% to 60%. Wall motion was normal;  there were no regional wall motion abnormalities. Doppler  parameters are consistent with abnormal left ventricular  relaxation (grade 1 diastolic dysfunction). Left atrium 39 mm   Assessment and Plan: 1. PAF Failing flecainide with more afib burden Discussed options with pt either sotalol, tiksoyn , feel that he is too young for amiodarone, and he did not care for the side effect profile. Also discussed that he would be a good candidate for ablation  He has not been monitoring episodes with HR/BP readings and I encouraged him to get a BP cuff and record BP/HR readings with episodes We also discussed that he could take an extra 1/2 tab of metoprolol if BP sys over 100 and HR over 100.  He would like to monitor  it for a while and will bring back in 3 weeks at which time will arrange for a peak flecainide level to see if any room to increase to 150 mg bid and discuss above options again. Will continue asa for chadsvasc score of 0   Vivian Neuwirth C. Alexina Niccoli, [facility] [address] [place], [place] [address] [phone]

## 2015-08-25 ENCOUNTER — Ambulatory Visit (HOSPITAL_COMMUNITY): Payer: BLUE CROSS/BLUE SHIELD | Admitting: Nurse Practitioner

## 2015-11-11 DIAGNOSIS — L03818 Cellulitis of other sites: Secondary | ICD-10-CM | POA: Diagnosis not present

## 2015-11-11 DIAGNOSIS — S90821A Blister (nonthermal), right foot, initial encounter: Secondary | ICD-10-CM | POA: Diagnosis not present

## 2016-02-15 ENCOUNTER — Emergency Department (HOSPITAL_BASED_OUTPATIENT_CLINIC_OR_DEPARTMENT_OTHER)
Admission: EM | Admit: 2016-02-15 | Discharge: 2016-02-15 | Disposition: A | Discharge status: Home / Self Care | Payer: BLUE CROSS/BLUE SHIELD | Attending: Emergency Medicine | Admitting: Emergency Medicine

## 2016-02-15 ENCOUNTER — Encounter (HOSPITAL_BASED_OUTPATIENT_CLINIC_OR_DEPARTMENT_OTHER): Payer: Self-pay | Admitting: Emergency Medicine

## 2016-02-15 DIAGNOSIS — S0003XA Contusion of scalp, initial encounter: Secondary | ICD-10-CM

## 2016-02-15 DIAGNOSIS — S0990XA Unspecified injury of head, initial encounter: Secondary | ICD-10-CM | POA: Diagnosis present

## 2016-02-15 DIAGNOSIS — Y9389 Activity, other specified: Secondary | ICD-10-CM | POA: Insufficient documentation

## 2016-02-15 DIAGNOSIS — Y999 Unspecified external cause status: Secondary | ICD-10-CM | POA: Insufficient documentation

## 2016-02-15 DIAGNOSIS — Z7982 Long term (current) use of aspirin: Secondary | ICD-10-CM | POA: Diagnosis not present

## 2016-02-15 DIAGNOSIS — W228XXA Striking against or struck by other objects, initial encounter: Secondary | ICD-10-CM | POA: Insufficient documentation

## 2016-02-15 DIAGNOSIS — Y929 Unspecified place or not applicable: Secondary | ICD-10-CM | POA: Insufficient documentation

## 2016-02-15 DIAGNOSIS — S00412A Abrasion of left ear, initial encounter: Secondary | ICD-10-CM

## 2016-02-15 HISTORY — DX: Unspecified atrial fibrillation: I48.91

## 2016-02-15 NOTE — ED Provider Notes (Signed)
[facility] DEPT [facility] Provider Note   CSN: [id number] Arrival date & time: 02/15/16  1611     History   Chief Complaint Chief Complaint  Patient presents with  . Ear Laceration    HPI John Hancock is a 55 y.o. male.  HPI Patient struck by limb to the left scalp and ear roughly 2 hours prior to presentation. No loss of consciousness. No visual changes. No nausea or vomiting. Sustained mild abrasion to the ear. No active bleeding. No changes in hearing. Denies any neck pain, focal weakness or numbness. No loss of balance. Patient is not taking a blood thinner. Past Medical History:  Diagnosis Date  . Atrial fibrillation ([place])   . Gout   . Hyperlipidemia     Patient Active Problem List   Diagnosis Date Noted  . Atrial fibrillation, new onset ([place]) 02/15/2015    Past Surgical History:  Procedure Laterality Date  . INGUINAL HERNIA REPAIR Bilateral   . SEPTOPLASTY    . SHOULDER ARTHROSCOPY    . TONSILLECTOMY         Home Medications    Prior to Admission medications   Medication Sig Start Date End Date Taking? Authorizing Provider  allopurinol (ZYLOPRIM) 300 MG tablet Take 1 tablet by mouth daily. Take 1 tab daily 02/07/15   Historical Provider, MD  aspirin 81 MG tablet Take 81 mg by mouth daily.    Historical Provider, MD  atorvastatin (LIPITOR) 10 MG tablet Take 1 tablet by mouth daily. Take 1 tab daily 11/29/14   Historical Provider, MD  flecainide (TAMBOCOR) 100 MG tablet Take 1 tablet (100 mg total) by mouth 2 (two) times daily. 06/08/15   Minus Breeding, MD  Glucosamine-Chondroit-Vit C-Mn (GLUCOSAMINE CHONDR 1500 COMPLX) CAPS Take 1 capsule by mouth daily.    Historical Provider, MD  metoprolol succinate (TOPROL-XL) 25 MG 24 hr tablet Take 1 tablet (25 mg total) by mouth daily. 06/03/15   Minus Breeding, MD  Probiotic Product (PROBIOTIC DAILY PO) Take 1 tablet by mouth daily.    Historical Provider, MD    Family History Family History  Problem Relation Age  of Onset  . Hypertension Mother   . Diabetes Father   . CAD Father 63    CABG, arrhythmia with ablation (no details)   . Hypertension Father   . Diabetes Maternal Grandmother   . Hypertension Maternal Grandmother   . Diabetes Maternal Grandfather   . Heart attack Maternal Grandfather   . Hypertension Maternal Grandfather   . Fainting Maternal Grandfather   . Diabetes Paternal Grandmother   . Hypertension Paternal Grandmother   . Fainting Paternal Grandmother   . Diabetes Paternal Grandfather   . Heart attack Paternal Grandfather   . Hypertension Paternal Grandfather   . Hypertension Brother     Social History Social History  Substance Use Topics  . Smoking status: Never Smoker  . Smokeless tobacco: Never Used  . Alcohol use 1.8 oz/week    3 Glasses of wine per week     Comment: a few times a week     Allergies   Patient has no known allergies.   Review of Systems Review of Systems  HENT: Negative for facial swelling and hearing loss.   Eyes: Negative for visual disturbance.  Gastrointestinal: Negative for nausea and vomiting.  Musculoskeletal: Negative for back pain, gait problem and neck pain.  Skin: Positive for wound. Negative for rash.  Neurological: Negative for syncope, weakness, numbness and headaches.  Psychiatric/Behavioral: Negative for confusion  and decreased concentration.  All other systems reviewed and are negative.    Physical Exam Updated Vital Signs BP 137/89   Pulse 71   Temp 97.8 F (36.6 C) (Oral)   Resp 18   Ht 6\' 4"  (1.93 m)   Wt 224 lb (101.6 kg)   SpO2 95%   BMI 27.27 kg/m   Physical Exam  Constitutional: He is oriented to person, place, and time. He appears well-developed and well-nourished. No distress.  HENT:  Head: Normocephalic and atraumatic.  Mouth/Throat: Oropharynx is clear and moist.  Patient has small posterior auricular scalp contusion on the left. No hemotympanum bilaterally. Left ear has a small abrasion over  this superior tip of the pinna. No active bleeding. There is contusion noted to the left ear with mild swelling. No definite drainable hematoma.  Eyes: EOM are normal. Pupils are equal, round, and reactive to light.  Neck: Normal range of motion. Neck supple.  No midline cervical tenderness to palpation.  Cardiovascular: Normal rate and regular rhythm.   Pulmonary/Chest: Effort normal and breath sounds normal.  Abdominal: Soft. Bowel sounds are normal. There is no tenderness. There is no rebound and no guarding.  Musculoskeletal: Normal range of motion. He exhibits no edema or tenderness.  Neurological: He is alert and oriented to person, place, and time.  Patient is alert and oriented x3 with clear, goal oriented speech. Patient has 5/5 motor in all extremities. Sensation is intact to light touch. Bilateral finger-to-nose is normal with no signs of dysmetria. Patient has a normal gait and walks without assistance.  Skin: Skin is warm and dry. Capillary refill takes less than 2 seconds. No rash noted. No erythema.  Psychiatric: He has a normal mood and affect. His behavior is normal.  Nursing note and vitals reviewed.    ED Treatments / Results  Labs (all labs ordered are listed, but only abnormal results are displayed) Labs Reviewed - No data to display  EKG  EKG Interpretation None       Radiology No results found.  Procedures Procedures (including critical care time)  Medications Ordered in ED Medications - No data to display   Initial Impression / Assessment and Plan / ED Course  I have reviewed the triage vital signs and the nursing notes.  Pertinent labs & imaging results that were available during my care of the patient were reviewed by me and considered in my medical decision making (see chart for details).  Clinical Course     Patient with ear abrasion and hematoma as well as scalp hematoma. No evidence of concussion. Patient is not on any blood thinners. Do not  believe that CT is necessary at this point. He's been given head injury precautions. Given developing hematoma, attempted to place bulky dressing in the emergency department the left ear. Patient refused dressing saying he would not wear it. Bacitracin and dressing placed to the abrasion of the ear. Advised follow-up with his primary physician.  Final Clinical Impressions(s) / ED Diagnoses   Final diagnoses:  Ear abrasion, left, initial encounter  Contusion of scalp, initial encounter    New Prescriptions New Prescriptions   No medications on file     Julianne Rice, MD 02/15/16 1723

## 2016-02-15 NOTE — ED Triage Notes (Signed)
Cutting trees and limb hit ear

## 2016-04-11 DIAGNOSIS — R8299 Other abnormal findings in urine: Secondary | ICD-10-CM | POA: Diagnosis not present

## 2016-04-11 DIAGNOSIS — Z125 Encounter for screening for malignant neoplasm of prostate: Secondary | ICD-10-CM | POA: Diagnosis not present

## 2016-04-11 DIAGNOSIS — M109 Gout, unspecified: Secondary | ICD-10-CM | POA: Diagnosis not present

## 2016-04-11 DIAGNOSIS — Z0000 Encounter for general adult medical examination without abnormal findings: Secondary | ICD-10-CM | POA: Diagnosis not present

## 2016-04-18 DIAGNOSIS — Z0000 Encounter for general adult medical examination without abnormal findings: Secondary | ICD-10-CM | POA: Diagnosis not present

## 2016-04-18 DIAGNOSIS — M545 Low back pain: Secondary | ICD-10-CM | POA: Diagnosis not present

## 2016-04-18 DIAGNOSIS — Z7901 Long term (current) use of anticoagulants: Secondary | ICD-10-CM | POA: Diagnosis not present

## 2016-04-18 DIAGNOSIS — I519 Heart disease, unspecified: Secondary | ICD-10-CM | POA: Diagnosis not present

## 2016-04-18 DIAGNOSIS — I480 Paroxysmal atrial fibrillation: Secondary | ICD-10-CM | POA: Diagnosis not present

## 2016-04-25 DIAGNOSIS — Z1212 Encounter for screening for malignant neoplasm of rectum: Secondary | ICD-10-CM | POA: Diagnosis not present

## 2016-05-23 ENCOUNTER — Telehealth: Payer: Self-pay | Admitting: Internal Medicine

## 2016-05-28 NOTE — Telephone Encounter (Signed)
Dr.Pyrtle reviewed records and accepted patient. Per Dr.Pyrtle pt due for repeat surveillance in May 2019. Recall will be put in the system. Left patient message to call back and discuss.

## 2016-05-29 DIAGNOSIS — L570 Actinic keratosis: Secondary | ICD-10-CM | POA: Diagnosis not present

## 2016-05-29 DIAGNOSIS — L821 Other seborrheic keratosis: Secondary | ICD-10-CM | POA: Diagnosis not present

## 2016-05-29 DIAGNOSIS — D225 Melanocytic nevi of trunk: Secondary | ICD-10-CM | POA: Diagnosis not present

## 2016-07-12 ENCOUNTER — Other Ambulatory Visit: Payer: Self-pay | Admitting: Cardiology

## 2016-08-12 ENCOUNTER — Other Ambulatory Visit: Payer: Self-pay | Admitting: Cardiology

## 2016-08-13 NOTE — Telephone Encounter (Signed)
REFILL 

## 2016-09-12 ENCOUNTER — Other Ambulatory Visit: Payer: Self-pay | Admitting: Cardiology

## 2016-09-13 ENCOUNTER — Telehealth: Payer: Self-pay | Admitting: Cardiology

## 2016-09-13 ENCOUNTER — Other Ambulatory Visit: Payer: Self-pay | Admitting: Cardiology

## 2016-09-13 MED ORDER — FLECAINIDE ACETATE 100 MG PO TABS: 100.0000 mg | ORAL_TABLET | Freq: Two times a day (BID) | ORAL | 0 refills | Status: DC

## 2016-09-13 NOTE — Telephone Encounter (Signed)
New message     *STAT* If patient is at the pharmacy, call can be transferred to refill team.   1. Which medications need to be refilled? (please list name of each medication and dose if known) flecainide (TAMBOCOR) 100 MG tablet  2. Which pharmacy/location (including street and city if local pharmacy) is medication to be sent to? [facility] on [address]  3. Do they need a 30 day or 90 day supply? 90 day supply

## 2016-09-13 NOTE — Telephone Encounter (Signed)
Rx(s) sent to pharmacy electronically.  

## 2016-09-13 NOTE — Telephone Encounter (Signed)
This refill is declined. Prescription sent earlier in the day for refill for one month. Patient needs an appointment. Last office visit was  early 2017

## 2016-10-10 ENCOUNTER — Other Ambulatory Visit: Payer: Self-pay | Admitting: Cardiology

## 2016-10-23 ENCOUNTER — Other Ambulatory Visit: Payer: Self-pay | Admitting: Cardiology

## 2016-11-16 ENCOUNTER — Other Ambulatory Visit: Payer: Self-pay | Admitting: Cardiology

## 2016-11-16 DIAGNOSIS — L820 Inflamed seborrheic keratosis: Secondary | ICD-10-CM | POA: Diagnosis not present

## 2016-11-16 DIAGNOSIS — D485 Neoplasm of uncertain behavior of skin: Secondary | ICD-10-CM | POA: Diagnosis not present

## 2017-04-22 DIAGNOSIS — Z125 Encounter for screening for malignant neoplasm of prostate: Secondary | ICD-10-CM | POA: Diagnosis not present

## 2017-04-22 DIAGNOSIS — M109 Gout, unspecified: Secondary | ICD-10-CM | POA: Diagnosis not present

## 2017-04-22 DIAGNOSIS — Z0000 Encounter for general adult medical examination without abnormal findings: Secondary | ICD-10-CM | POA: Diagnosis not present

## 2017-04-29 DIAGNOSIS — Z7901 Long term (current) use of anticoagulants: Secondary | ICD-10-CM | POA: Diagnosis not present

## 2017-04-29 DIAGNOSIS — M109 Gout, unspecified: Secondary | ICD-10-CM | POA: Diagnosis not present

## 2017-04-29 DIAGNOSIS — E7800 Pure hypercholesterolemia, unspecified: Secondary | ICD-10-CM | POA: Diagnosis not present

## 2017-04-29 DIAGNOSIS — Z1331 Encounter for screening for depression: Secondary | ICD-10-CM | POA: Diagnosis not present

## 2017-04-29 DIAGNOSIS — I480 Paroxysmal atrial fibrillation: Secondary | ICD-10-CM | POA: Diagnosis not present

## 2017-04-29 DIAGNOSIS — Z0000 Encounter for general adult medical examination without abnormal findings: Secondary | ICD-10-CM | POA: Diagnosis not present

## 2017-05-02 DIAGNOSIS — Z1212 Encounter for screening for malignant neoplasm of rectum: Secondary | ICD-10-CM | POA: Diagnosis not present

## 2017-05-08 ENCOUNTER — Emergency Department (HOSPITAL_BASED_OUTPATIENT_CLINIC_OR_DEPARTMENT_OTHER): Payer: BLUE CROSS/BLUE SHIELD

## 2017-05-08 ENCOUNTER — Other Ambulatory Visit: Payer: Self-pay

## 2017-05-08 ENCOUNTER — Encounter (HOSPITAL_BASED_OUTPATIENT_CLINIC_OR_DEPARTMENT_OTHER): Payer: Self-pay | Admitting: Emergency Medicine

## 2017-05-08 ENCOUNTER — Emergency Department (HOSPITAL_BASED_OUTPATIENT_CLINIC_OR_DEPARTMENT_OTHER)
Admission: EM | Admit: 2017-05-08 | Discharge: 2017-05-08 | Disposition: A | Discharge status: Home / Self Care | Payer: BLUE CROSS/BLUE SHIELD | Attending: Emergency Medicine | Admitting: Emergency Medicine

## 2017-05-08 DIAGNOSIS — M25461 Effusion, right knee: Secondary | ICD-10-CM

## 2017-05-08 DIAGNOSIS — M79604 Pain in right leg: Secondary | ICD-10-CM | POA: Diagnosis not present

## 2017-05-08 DIAGNOSIS — M7989 Other specified soft tissue disorders: Secondary | ICD-10-CM | POA: Diagnosis not present

## 2017-05-08 MED ORDER — IBUPROFEN 800 MG PO TABS: 800.0000 mg | ORAL_TABLET | Freq: Three times a day (TID) | ORAL | 0 refills | Status: AC | PRN

## 2017-05-08 MED ORDER — OXYCODONE-ACETAMINOPHEN 5-325 MG PO TABS
1.0000 | ORAL_TABLET | Freq: Once | ORAL | Status: AC
  Administered 2017-05-08: 1 via ORAL
  Filled 2017-05-08: qty 1

## 2017-05-08 MED ORDER — OXYCODONE-ACETAMINOPHEN 5-325 MG PO TABS: 1.0000 | ORAL_TABLET | Freq: Four times a day (QID) | ORAL | 0 refills | Status: DC | PRN

## 2017-05-08 NOTE — ED Notes (Signed)
Right knee is warmer compared to the left knee.  Patient claimed that it hurts.

## 2017-05-08 NOTE — ED Notes (Signed)
ED Provider at bedside. 

## 2017-05-08 NOTE — ED Triage Notes (Signed)
Onset of rt leg swelling and pain x 1 hr ago

## 2017-05-08 NOTE — Discharge Instructions (Signed)
You were seen in the ED today with leg pain. You do not have a blood clot in the leg. Your knee has fluid on it that should resolve with time. Take the medication provided as needed and apply ice for the next 24 hours. Keep the leg elevated. Apply a compression wrap or knee sleeve for comfort.   Return to the ED with any fever, chills, knee redness, or increased warmth.

## 2017-05-08 NOTE — ED Provider Notes (Signed)
Emergency Department Provider Note   I have reviewed the triage vital signs and the nursing notes.   HISTORY  Chief Complaint Leg Pain   HPI John Hancock is a 57 y.o. male with PMH of a-fib, HLD, and gout to the emergency department for evaluation of sudden onset right leg pain focused behind the knee.  He has had some mild right knee swelling in addition to pain.  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  No injury during this activity that he was aware of.  No falls.  He returned home and was resting when he had sudden pain and noticed some swelling in the area.  He is not anticoagulated.  He noticed some swelling around the knee and pain behind the knee.  No fevers or chills.  No associated redness.  No radiation of symptoms or modifying factors.   Past Medical History:  Diagnosis Date  . Atrial fibrillation ([place])   . Gout   . Hyperlipidemia     Patient Active Problem List   Diagnosis Date Noted  . Atrial fibrillation, new onset ([place]) 02/15/2015    Past Surgical History:  Procedure Laterality Date  . INGUINAL HERNIA REPAIR Bilateral   . SEPTOPLASTY    . SHOULDER ARTHROSCOPY    . TONSILLECTOMY      Current Outpatient Rx  . Order #: [id number] Class: Historical Med  . Order #: [id number] Class: Historical Med  . Order #: [id number] Class: Historical Med  . Order #: [id number] Class: Normal  . Order #: [id number] Class: Historical Med  . Order #: [id number] Class: Print  . Order #: [id number] Class: Normal  . Order #: [id number] Class: Print  . Order #: [id number] Class: Historical Med    Allergies Patient has no known allergies.  Family History  Problem Relation Age of Onset  . Hypertension Mother   . Diabetes Father   . CAD Father 39       CABG, arrhythmia with ablation (no details)   . Hypertension Father   . Diabetes Maternal Grandmother   . Hypertension Maternal Grandmother   . Diabetes Maternal Grandfather   . Heart attack Maternal  Grandfather   . Hypertension Maternal Grandfather   . Fainting Maternal Grandfather   . Diabetes Paternal Grandmother   . Hypertension Paternal Grandmother   . Fainting Paternal Grandmother   . Diabetes Paternal Grandfather   . Heart attack Paternal Grandfather   . Hypertension Paternal Grandfather   . Hypertension Brother     Social History Social History   Tobacco Use  . Smoking status: Never Smoker  . Smokeless tobacco: Never Used  Substance Use Topics  . Alcohol use: Yes    Alcohol/week: 1.8 oz    Types: 3 Glasses of wine per week    Comment: a few times a week  . Drug use: No    Review of Systems  Constitutional: No fever/chills Eyes: No visual changes. ENT: No sore throat. Cardiovascular: Denies chest pain. Respiratory: Denies shortness of breath. Gastrointestinal: No abdominal pain.  No nausea, no vomiting.  No diarrhea.  No constipation. Genitourinary: Negative for dysuria. Musculoskeletal: Negative for back pain. Right leg and knee pain with some swelling.  Skin: Negative for rash. Neurological: Negative for headaches, focal weakness or numbness.  10-point ROS otherwise negative.  ____________________________________________   PHYSICAL EXAM:  VITAL SIGNS: ED Triage Vitals  Enc Vitals Group     BP 05/08/17 2024 129/81     Pulse Rate 05/08/17 2024 66  Resp 05/08/17 2024 18     Temp 05/08/17 2024 98.6 F (37 C)     Temp Source 05/08/17 2024 Oral     SpO2 05/08/17 2024 95 %     Weight 05/08/17 2015 225 lb (102.1 kg)     Height 05/08/17 2015 6\' 4"  (1.93 m)     Pain Score 05/08/17 2015 10   Constitutional: Alert and oriented. Well appearing and in no acute distress. Eyes: Conjunctivae are normal. Head: Atraumatic. Nose: No congestion/rhinnorhea. Mouth/Throat: Mucous membranes are moist.  Neck: No stridor.  Cardiovascular: Good peripheral circulation.  Respiratory: Normal respiratory effort.  Gastrointestinal: No distention.  Musculoskeletal:  Moderate right knee effusion without erythema or warmth. Mild posterior knee tenderness. No calf swelling or tenderness. Pain with ROM but patient is ambulatory.  Neurologic:  Normal speech and language. No gross focal neurologic deficits are appreciated.  Skin:  Skin is warm, dry and intact. No rash noted.  ____________________________________________  RADIOLOGY  Dg Knee 2 Views Right  Result Date: 05/08/2017 CLINICAL DATA:  Right knee pain EXAM: RIGHT KNEE - 1-2 VIEW COMPARISON:  None. FINDINGS: Moderate joint effusion. No acute bony abnormality. Specifically, no fracture, subluxation, or dislocation. IMPRESSION: Moderate joint effusion.  No acute bony abnormality. Electronically Signed   By: Rolm Baptise M.D.   On: 05/08/2017 22:25   US Venous Img Lower Right (dvt Study)  Result Date: 05/08/2017 CLINICAL DATA:  Right knee swelling EXAM: RIGHT LOWER EXTREMITY VENOUS DOPPLER ULTRASOUND TECHNIQUE: Gray-scale sonography with graded compression, as well as color Doppler and duplex ultrasound were performed to evaluate the lower extremity deep venous systems from the level of the common femoral vein and including the common femoral, femoral, profunda femoral, popliteal and calf veins including the posterior tibial, peroneal and gastrocnemius veins when visible. The superficial great saphenous vein was also interrogated. Spectral Doppler was utilized to evaluate flow at rest and with distal augmentation maneuvers in the common femoral, femoral and popliteal veins. COMPARISON:  None. FINDINGS: Contralateral Common Femoral Vein: Respiratory phasicity is normal and symmetric with the symptomatic side. No evidence of thrombus. Normal compressibility. Common Femoral Vein: No evidence of thrombus. Normal compressibility, respiratory phasicity and response to augmentation. Saphenofemoral Junction: No evidence of thrombus. Normal compressibility and flow on color Doppler imaging. Profunda Femoral Vein: No evidence  of thrombus. Normal compressibility and flow on color Doppler imaging. Femoral Vein: No evidence of thrombus. Normal compressibility, respiratory phasicity and response to augmentation. Popliteal Vein: No evidence of thrombus. Normal compressibility, respiratory phasicity and response to augmentation. Calf Veins: No evidence of thrombus. Normal compressibility and flow on color Doppler imaging. Superficial Great Saphenous Vein: No evidence of thrombus. Normal compressibility. Venous Reflux:  None. Other Findings:  Right knee joint effusion noted. IMPRESSION: No evidence of deep venous thrombosis. Right knee joint effusion. Electronically Signed   By: Rolm Baptise M.D.   On: 05/08/2017 22:27    ____________________________________________   PROCEDURES  Procedure(s) performed:   Procedures  None ____________________________________________   INITIAL IMPRESSION / ASSESSMENT AND PLAN / ED COURSE  Pertinent labs & imaging results that were available during my care of the patient were reviewed by me and considered in my medical decision making (see chart for details).  Patient presents to the emergency department for evaluation of right knee pain with some swelling.  Most of his pain is posterior.  He does have a small joint effusion.  He has been working today by cutting trees states this is not unusual for him.  Plan for ultrasound of the right lower extremity to rule out DVT.  He is not having chest pain or dyspnea.  Plan for plain film of the knee as well.  [place] and x-ray negative for acute fracture or DVT. No evidence of septic joint. Knee sleeve applied. Plan for motrin and sport med follow up. Discussed symptoms of septic joint and need for return shoulder redness, warmth, or fever develop.   At this time, I do not feel there is any life-threatening condition present. I have reviewed and discussed all results (EKG, imaging, lab, urine as appropriate), exam findings with patient. I have  reviewed nursing notes and appropriate previous records.  I feel the patient is safe to be discharged home without further emergent workup. Discussed usual and customary return precautions. Patient and family (if present) verbalize understanding and are comfortable with this plan.  Patient will follow-up with their primary care provider. If they do not have a primary care provider, information for follow-up has been provided to them. All questions have been answered.  ____________________________________________  FINAL CLINICAL IMPRESSION(S) / ED DIAGNOSES  Final diagnoses:  Right leg pain  Effusion of right knee     MEDICATIONS GIVEN DURING THIS VISIT:  Medications  oxyCODONE-acetaminophen (PERCOCET/ROXICET) 5-325 MG per tablet 1 tablet (1 tablet Oral Given 05/08/17 2128)     NEW OUTPATIENT MEDICATIONS STARTED DURING THIS VISIT:  Discharge Medication List as of 05/08/2017 10:44 PM    START taking these medications   Details  ibuprofen (ADVIL,MOTRIN) 800 MG tablet Take 1 tablet (800 mg total) by mouth every 8 (eight) hours as needed., Starting Wed 05/08/2017, Print    oxyCODONE-acetaminophen (PERCOCET/ROXICET) 5-325 MG tablet Take 1 tablet by mouth every 6 (six) hours as needed for severe pain., Starting Wed 05/08/2017, Print        Note:  This document was prepared using Dragon voice recognition software and may include unintentional dictation errors.  Nanda [facility], MD Emergency Medicine    Raiya Stainback, [facility], MD 05/09/17 [phone]

## 2017-05-16 ENCOUNTER — Ambulatory Visit (INDEPENDENT_AMBULATORY_CARE_PROVIDER_SITE_OTHER): Payer: BLUE CROSS/BLUE SHIELD | Admitting: Family Medicine

## 2017-05-16 ENCOUNTER — Encounter: Payer: Self-pay | Admitting: Family Medicine

## 2017-05-16 DIAGNOSIS — M25461 Effusion, right knee: Secondary | ICD-10-CM

## 2017-05-16 NOTE — Assessment & Plan Note (Signed)
Acute. Likely related to chronic OA. DVT ruled out by U/S. Knee xray c/w mod knee effusion though not weight bearing. Unlikely gouty arthritis given recently controlled uric acid level. On aspirin for a-fib though hemarthrosis less likely. Not c/w septic arthritis.  Effusion spontaneously resolving with conservative therapy. - Advised patient to continue exercising and avoid squats - Continue tylenol and topical pain meds as needed - Reviewed return precautions

## 2017-05-16 NOTE — Patient Instructions (Signed)
Your pain is due to arthritis. These are the different medications you can take for this: Tylenol 500mg  1-2 tabs three times a day for pain. Capsaicin, aspercreme, or biofreeze topically up to four times a day may also help with pain. Some supplements that may help for arthritis: Boswellia extract, curcumin, pycnogenol Aleve 1-2 tabs twice a day with food if needed. Cortisone injections are an option. If cortisone injections do not help, there are different types of shots that may help but they take longer to take effect. It's important that you continue to stay active. Straight leg raises, knee extensions 3 sets of 10 once a day (add ankle weight if these become too easy). Consider physical therapy to strengthen muscles around the joint that hurts to take pressure off of the joint itself. Shoe inserts with good arch support may be helpful. Heat or ice 15 minutes at a time 3-4 times a day as needed to help with pain. Water aerobics and cycling with low resistance are the best two types of exercise for arthritis though any exercise is ok as long as it doesn't worsen the pain. Follow up with me as needed.

## 2017-05-16 NOTE — Progress Notes (Signed)
   Subjective   Patient ID: John Hancock    DOB: February 03, 1961, 57 y.o. male   MRN: [id number]  CC: "Right knee pain"  HPI: John Hancock is a 57 y.o. male who presents to clinic today for the following:  Right knee pain: Patient is an active 57 year old who enjoys running marathons and golfing who presented to the ED 2 weeks ago with sudden onset of right knee swelling and suprapatellar pain after yard work.  DVT was ruled out by ultrasound and knee radiographs showed moderate effusion though imaging was without weight bearing.  He was told to follow up at the sports medicine clinic.  He denies history of trauma, fall, or twisting of the knee.  Patient endorses chronic history of subpatellar aching worse with squats.  He has no history of prior surgeries.  He is currently on aspirin for atrial fibrillation.  He is also on allopurinol for gout but denies history of gouty arthritis of the knee.  The knee edema and pain significantly improved over the course of 36 hours following ice, rest, and elevation.  He has minimal use of Advil.    ROS: see HPI for pertinent.  [place]: Atrial fibrillation, HLD, gout. Surgical history tonsillectomy, shoulder arthroscopy, hernia, septoplasty. Family history HTN, CAD, DM. Smoking status reviewed. Medications reviewed.  Objective   BP 130/84   Ht 6\' 4"  (1.93 m)   Wt 225 lb (102.1 kg)   BMI 27.39 kg/m  Vitals and nursing note reviewed.  General: well nourished, well developed, NAD with non-toxic appearance HEENT: normocephalic, atraumatic, moist mucous membranes Cardiovascular: regular rate and rhythm without murmurs, rubs, or gallops Lungs: clear to auscultation bilaterally with normal work of breathing Skin: warm, dry, no rashes or lesions, cap refill < 2 seconds Extremities: warm and well perfused, normal tone, no edema MSK: right knee slightly large than left, positive fluid wave superior to patella, mild tenderness to lateral suprapatellar region, no  erythema or calor to knee, moderate crepitus with knee extension without tracking of patella, active and passive ROM intact, negative anterior and posterior drawer test, negative valgus and varus stress test, negative McMurray test, neurovascular intact  Assessment & Plan   Effusion of right knee Acute. Likely related to chronic OA. DVT ruled out by U/S. Knee xray c/w mod knee effusion though not weight bearing. Unlikely gouty arthritis given recently controlled uric acid level. On aspirin for a-fib though hemarthrosis less likely. Not c/w septic arthritis.  Effusion spontaneously resolving with conservative therapy. - Advised patient to continue exercising and avoid squats - Continue tylenol and topical pain meds as needed - Reviewed return precautions  No orders of the defined types were placed in this encounter.  No orders of the defined types were placed in this encounter.   John Hancock, [place], PGY-2 05/16/2017, 10:43 AM

## 2017-07-26 ENCOUNTER — Encounter: Payer: Self-pay | Admitting: Internal Medicine

## 2017-09-02 DIAGNOSIS — H1033 Unspecified acute conjunctivitis, bilateral: Secondary | ICD-10-CM | POA: Diagnosis not present

## 2017-09-06 ENCOUNTER — Encounter: Payer: Self-pay | Admitting: Internal Medicine

## 2017-10-29 ENCOUNTER — Ambulatory Visit (AMBULATORY_SURGERY_CENTER): Payer: Self-pay | Admitting: *Deleted

## 2017-10-29 ENCOUNTER — Encounter: Payer: Self-pay | Admitting: Internal Medicine

## 2017-10-29 VITALS — Ht 76.0 in | Wt 234.2 lb

## 2017-10-29 DIAGNOSIS — Z86010 Personal history of colonic polyps: Secondary | ICD-10-CM

## 2017-10-29 MED ORDER — NA SULFATE-K SULFATE-MG SULF 17.5-3.13-1.6 GM/177ML PO SOLN: 1.0000 | Freq: Once | ORAL | 0 refills | Status: AC

## 2017-11-12 ENCOUNTER — Encounter: Payer: Self-pay | Admitting: Internal Medicine

## 2017-11-12 ENCOUNTER — Ambulatory Visit (AMBULATORY_SURGERY_CENTER): Payer: BLUE CROSS/BLUE SHIELD | Admitting: Internal Medicine

## 2017-11-12 VITALS — BP 115/76 | HR 58 | Temp 98.0°F | Resp 25 | Ht 76.0 in | Wt 234.0 lb

## 2017-11-12 DIAGNOSIS — D124 Benign neoplasm of descending colon: Secondary | ICD-10-CM | POA: Diagnosis not present

## 2017-11-12 DIAGNOSIS — D123 Benign neoplasm of transverse colon: Secondary | ICD-10-CM | POA: Diagnosis not present

## 2017-11-12 DIAGNOSIS — Z86010 Personal history of colonic polyps: Secondary | ICD-10-CM

## 2017-11-12 DIAGNOSIS — D120 Benign neoplasm of cecum: Secondary | ICD-10-CM

## 2017-11-12 MED ORDER — SODIUM CHLORIDE 0.9 % IV SOLN: 500.0000 mL | Freq: Once | INTRAVENOUS | Status: DC

## 2017-11-12 NOTE — Progress Notes (Signed)
Pt's states no medical or surgical changes since previsit or office visit. 

## 2017-11-12 NOTE — Progress Notes (Signed)
Report to PACU, RN, vss, BBS= Clear.  

## 2017-11-12 NOTE — Progress Notes (Signed)
No problems noted in the recovery room. maw 

## 2017-11-12 NOTE — Op Note (Signed)
[facility] Patient Name: John Hancock Procedure Date: 11/12/2017 2:13 PM MRN: [id number] Endoscopist: Jerene Bears , MD Age: 57 Referring MD:  Date of Birth: 03-07-1961 Gender: Male Account #: [id number] Procedure:                Colonoscopy Indications:              High risk colon cancer surveillance: Personal                            history of colonic polyps, Last colonoscopy 5 years                            ago Medicines:                Monitored Anesthesia Care Procedure:                Pre-Anesthesia Assessment:                           - Prior to the procedure, a History and Physical                            was performed, and patient medications and                            allergies were reviewed. The patient's tolerance of                            previous anesthesia was also reviewed. The risks                            and benefits of the procedure and the sedation                            options and risks were discussed with the patient.                            All questions were answered, and informed consent                            was obtained. Prior Anticoagulants: The patient has                            taken no previous anticoagulant or antiplatelet                            agents. ASA Grade Assessment: II - A patient with                            mild systemic disease. After reviewing the risks                            and benefits, the patient was deemed in  satisfactory condition to undergo the procedure.                           After obtaining informed consent, the colonoscope                            was passed under direct vision. Throughout the                            procedure, the patient's blood pressure, pulse, and                            oxygen saturations were monitored continuously. The                            Colonoscope was introduced through the anus and               advanced to the cecum, identified by appendiceal                            orifice and ileocecal valve. The colonoscopy was                            performed without difficulty. The patient tolerated                            the procedure well. The quality of the bowel                            preparation was good. The ileocecal valve,                            appendiceal orifice, and rectum were photographed. Scope In: 2:26:39 PM Scope Out: 4:09:81 PM Scope Withdrawal Time: 0 hours 14 minutes 48 seconds  Total Procedure Duration: 0 hours 19 minutes 33 seconds  Findings:                 The digital rectal exam was normal.                           Two sessile polyps were found in the cecum. The                            polyps were 4 to 5 mm in size. These polyps were                            removed with a cold snare. Resection and retrieval                            were complete.                           A 5 mm polyp was found in the transverse colon. The  polyp was sessile. The polyp was removed with a                            cold snare. Resection and retrieval were complete.                           A 5 mm polyp was found in the descending colon. The                            polyp was sessile. The polyp was removed with a                            cold snare. Resection and retrieval were complete.                           Internal hemorrhoids and hypertrophied anal                            papillae were found during retroflexion. The                            hemorrhoids were small. Complications:            No immediate complications. Estimated Blood Loss:     Estimated blood loss was minimal. Impression:               - Two 4 to 5 mm polyps in the cecum, removed with a                            cold snare. Resected and retrieved.                           - One 5 mm polyp in the transverse colon, removed                             with a cold snare. Resected and retrieved.                           - One 5 mm polyp in the descending colon, removed                            with a cold snare. Resected and retrieved.                           - Small internal hemorrhoids. Recommendation:           - Patient has a contact number available for                            emergencies. The signs and symptoms of potential                            delayed complications were discussed with the  patient. Return to normal activities tomorrow.                            Written discharge instructions were provided to the                            patient.                           - Resume previous diet.                           - Continue present medications.                           - Await pathology results.                           - Repeat colonoscopy is recommended. The                            colonoscopy date will be determined after pathology                            results from today's exam become available for                            review. Jerene Bears, MD 11/12/2017 2:51:06 PM This report has been signed electronically.

## 2017-11-12 NOTE — Progress Notes (Signed)
Called to room to assist during endoscopic procedure.  Patient ID and intended procedure confirmed with present staff. Received instructions for my participation in the procedure from the performing physician.  

## 2017-11-12 NOTE — Patient Instructions (Signed)
YOU HAD AN ENDOSCOPIC PROCEDURE TODAY AT THE [facility]:   Refer to the procedure report that was given to you for any specific questions about what was found during the examination.  If the procedure report does not answer your questions, please call your gastroenterologist to clarify.  If you requested that your care partner not be given the details of your procedure findings, then the procedure report has been included in a sealed envelope for you to review at your convenience later.  YOU SHOULD EXPECT: Some feelings of bloating in the abdomen. Passage of more gas than usual.  Walking can help get rid of the air that was put into your GI tract during the procedure and reduce the bloating. If you had a lower endoscopy (such as a colonoscopy or flexible sigmoidoscopy) you may notice spotting of blood in your stool or on the toilet paper. If you underwent a bowel prep for your procedure, you may not have a normal bowel movement for a few days.  Please Note:  You might notice some irritation and congestion in your nose or some drainage.  This is from the oxygen used during your procedure.  There is no need for concern and it should clear up in a day or so.  SYMPTOMS TO REPORT IMMEDIATELY:   Following lower endoscopy (colonoscopy or flexible sigmoidoscopy):  Excessive amounts of blood in the stool  Significant tenderness or worsening of abdominal pains  Swelling of the abdomen that is new, acute  Fever of 100F or higher   For urgent or emergent issues, a gastroenterologist can be reached at any hour by calling [phone].   DIET:  We do recommend a small meal at first, but then you may proceed to your regular diet.  Drink plenty of fluids but you should avoid alcoholic beverages for 24 hours.  ACTIVITY:  You should plan to take it easy for the rest of today and you should NOT DRIVE or use heavy machinery until tomorrow (because of the sedation medicines used during the test).     FOLLOW UP: Our staff will call the number listed on your records the next business day following your procedure to check on you and address any questions or concerns that you may have regarding the information given to you following your procedure. If we do not reach you, we will leave a message.  However, if you are feeling well and you are not experiencing any problems, there is no need to return our call.  We will assume that you have returned to your regular daily activities without incident.  If any biopsies were taken you will be contacted by phone or by letter within the next 1-3 weeks.  Please call us at [phone] if you have not heard about the biopsies in 3 weeks.    SIGNATURES/CONFIDENTIALITY: You and/or your care partner have signed paperwork which will be entered into your electronic medical record.  These signatures attest to the fact that that the information above on your After Visit Summary has been reviewed and is understood.  Full responsibility of the confidentiality of this discharge information lies with you and/or your care-partner.    Handouts were given to your care partner on polyps and hemorrhoids. You may resume your current medications today. Await biopsy results. Please call if any questions or concerns.   

## 2017-11-13 ENCOUNTER — Telehealth: Payer: Self-pay | Admitting: *Deleted

## 2017-11-13 ENCOUNTER — Telehealth: Payer: Self-pay

## 2017-11-13 NOTE — Telephone Encounter (Signed)
  Follow up Call-  Call back number 11/12/2017  Post procedure Call Back phone  # [phone]  Permission to leave phone message Yes  Some recent data might be hidden     No answer.  No ID on voicemail.   Angela/Call-back

## 2017-11-13 NOTE — Telephone Encounter (Signed)
  Follow up Call-  Call back number 11/12/2017  Post procedure Call Back phone  # [phone]  Permission to leave phone message Yes  Some recent data might be hidden     Patient questions:  Do you have a fever, pain , or abdominal swelling? No. Pain Score  0 *  Have you tolerated food without any problems? Yes.    Have you been able to return to your normal activities? Yes.    Do you have any questions about your discharge instructions: Diet   No. Medications  No. Follow up visit  No.  Do you have questions or concerns about your Care? No.  Actions: * If pain score is 4 or above: No action needed, pain <4.

## 2017-11-15 ENCOUNTER — Encounter: Payer: Self-pay | Admitting: Internal Medicine

## 2017-12-25 DIAGNOSIS — Z23 Encounter for immunization: Secondary | ICD-10-CM | POA: Diagnosis not present

## 2018-04-22 DIAGNOSIS — Z0000 Encounter for general adult medical examination without abnormal findings: Secondary | ICD-10-CM | POA: Diagnosis not present

## 2018-04-22 DIAGNOSIS — Z125 Encounter for screening for malignant neoplasm of prostate: Secondary | ICD-10-CM | POA: Diagnosis not present

## 2018-04-22 DIAGNOSIS — M109 Gout, unspecified: Secondary | ICD-10-CM | POA: Diagnosis not present

## 2018-04-22 DIAGNOSIS — E7800 Pure hypercholesterolemia, unspecified: Secondary | ICD-10-CM | POA: Diagnosis not present

## 2018-04-22 DIAGNOSIS — R82998 Other abnormal findings in urine: Secondary | ICD-10-CM | POA: Diagnosis not present

## 2018-04-29 DIAGNOSIS — Z0000 Encounter for general adult medical examination without abnormal findings: Secondary | ICD-10-CM | POA: Diagnosis not present

## 2018-04-29 DIAGNOSIS — I480 Paroxysmal atrial fibrillation: Secondary | ICD-10-CM | POA: Diagnosis not present

## 2018-04-29 DIAGNOSIS — E7800 Pure hypercholesterolemia, unspecified: Secondary | ICD-10-CM | POA: Diagnosis not present

## 2018-04-29 DIAGNOSIS — I519 Heart disease, unspecified: Secondary | ICD-10-CM | POA: Diagnosis not present

## 2018-04-29 DIAGNOSIS — Z7901 Long term (current) use of anticoagulants: Secondary | ICD-10-CM | POA: Diagnosis not present

## 2018-04-29 DIAGNOSIS — Z1331 Encounter for screening for depression: Secondary | ICD-10-CM | POA: Diagnosis not present

## 2019-01-01 DIAGNOSIS — N486 Induration penis plastica: Secondary | ICD-10-CM | POA: Diagnosis not present

## 2019-01-13 DIAGNOSIS — Z1159 Encounter for screening for other viral diseases: Secondary | ICD-10-CM | POA: Diagnosis not present

## 2019-01-26 DIAGNOSIS — N486 Induration penis plastica: Secondary | ICD-10-CM | POA: Diagnosis not present

## 2019-03-03 DIAGNOSIS — N486 Induration penis plastica: Secondary | ICD-10-CM | POA: Diagnosis not present

## 2019-03-04 DIAGNOSIS — N486 Induration penis plastica: Secondary | ICD-10-CM | POA: Diagnosis not present

## 2019-03-12 DIAGNOSIS — L814 Other melanin hyperpigmentation: Secondary | ICD-10-CM | POA: Diagnosis not present

## 2019-03-12 DIAGNOSIS — D225 Melanocytic nevi of trunk: Secondary | ICD-10-CM | POA: Diagnosis not present

## 2019-03-12 DIAGNOSIS — L570 Actinic keratosis: Secondary | ICD-10-CM | POA: Diagnosis not present

## 2019-03-12 DIAGNOSIS — X32XXXS Exposure to sunlight, sequela: Secondary | ICD-10-CM | POA: Diagnosis not present

## 2019-04-13 DIAGNOSIS — N486 Induration penis plastica: Secondary | ICD-10-CM | POA: Diagnosis not present

## 2019-04-28 DIAGNOSIS — E7800 Pure hypercholesterolemia, unspecified: Secondary | ICD-10-CM | POA: Diagnosis not present

## 2019-04-28 DIAGNOSIS — M109 Gout, unspecified: Secondary | ICD-10-CM | POA: Diagnosis not present

## 2019-04-28 DIAGNOSIS — Z0000 Encounter for general adult medical examination without abnormal findings: Secondary | ICD-10-CM | POA: Diagnosis not present

## 2019-04-28 DIAGNOSIS — Z125 Encounter for screening for malignant neoplasm of prostate: Secondary | ICD-10-CM | POA: Diagnosis not present

## 2019-04-29 DIAGNOSIS — N486 Induration penis plastica: Secondary | ICD-10-CM | POA: Diagnosis not present

## 2019-05-01 DIAGNOSIS — N486 Induration penis plastica: Secondary | ICD-10-CM | POA: Diagnosis not present

## 2019-05-04 DIAGNOSIS — R82998 Other abnormal findings in urine: Secondary | ICD-10-CM | POA: Diagnosis not present

## 2019-05-05 DIAGNOSIS — Z0000 Encounter for general adult medical examination without abnormal findings: Secondary | ICD-10-CM | POA: Diagnosis not present

## 2019-05-05 DIAGNOSIS — I480 Paroxysmal atrial fibrillation: Secondary | ICD-10-CM | POA: Diagnosis not present

## 2019-05-05 DIAGNOSIS — M109 Gout, unspecified: Secondary | ICD-10-CM | POA: Diagnosis not present

## 2019-05-05 DIAGNOSIS — Z1331 Encounter for screening for depression: Secondary | ICD-10-CM | POA: Diagnosis not present

## 2019-05-05 DIAGNOSIS — M545 Low back pain: Secondary | ICD-10-CM | POA: Diagnosis not present

## 2019-05-05 DIAGNOSIS — E7800 Pure hypercholesterolemia, unspecified: Secondary | ICD-10-CM | POA: Diagnosis not present

## 2019-05-06 DIAGNOSIS — Z1212 Encounter for screening for malignant neoplasm of rectum: Secondary | ICD-10-CM | POA: Diagnosis not present

## 2019-05-07 ENCOUNTER — Encounter (HOSPITAL_BASED_OUTPATIENT_CLINIC_OR_DEPARTMENT_OTHER): Payer: Self-pay

## 2019-05-07 ENCOUNTER — Other Ambulatory Visit: Payer: Self-pay

## 2019-05-07 ENCOUNTER — Emergency Department (HOSPITAL_BASED_OUTPATIENT_CLINIC_OR_DEPARTMENT_OTHER): Payer: BC Managed Care – PPO

## 2019-05-07 ENCOUNTER — Emergency Department (HOSPITAL_BASED_OUTPATIENT_CLINIC_OR_DEPARTMENT_OTHER)
Admission: EM | Admit: 2019-05-07 | Discharge: 2019-05-07 | Disposition: A | Discharge status: Home / Self Care | Payer: BC Managed Care – PPO | Attending: Emergency Medicine | Admitting: Emergency Medicine

## 2019-05-07 DIAGNOSIS — Y998 Other external cause status: Secondary | ICD-10-CM | POA: Insufficient documentation

## 2019-05-07 DIAGNOSIS — Y9367 Activity, basketball: Secondary | ICD-10-CM | POA: Insufficient documentation

## 2019-05-07 DIAGNOSIS — W500XXA Accidental hit or strike by another person, initial encounter: Secondary | ICD-10-CM | POA: Diagnosis not present

## 2019-05-07 DIAGNOSIS — Y92310 Basketball court as the place of occurrence of the external cause: Secondary | ICD-10-CM | POA: Insufficient documentation

## 2019-05-07 DIAGNOSIS — Z79899 Other long term (current) drug therapy: Secondary | ICD-10-CM | POA: Insufficient documentation

## 2019-05-07 DIAGNOSIS — S2232XA Fracture of one rib, left side, initial encounter for closed fracture: Secondary | ICD-10-CM

## 2019-05-07 DIAGNOSIS — Z7982 Long term (current) use of aspirin: Secondary | ICD-10-CM | POA: Insufficient documentation

## 2019-05-07 DIAGNOSIS — S299XXA Unspecified injury of thorax, initial encounter: Secondary | ICD-10-CM | POA: Diagnosis not present

## 2019-05-07 NOTE — ED Triage Notes (Signed)
Pt states he was elbowed left rib area 4 days ago playing basketball-NAD-steady gait

## 2019-05-07 NOTE — ED Provider Notes (Signed)
[facility] EMERGENCY DEPARTMENT Provider Note   CSN: [id number] Arrival date & time: 05/07/19  1721     History Chief Complaint  Patient presents with  . Rib Injury    John Hancock is a 59 y.o. male.  HPI 59 year old male presents with left chest wall pain.  On 2/21 he was elbowed while playing basketball.  Has had intermittent pain since.  Sometimes certain movements or twisting makes it worse.  No pleuritic pain or shortness of breath.  Has not take anything for the pain.  Most of the time is moderate.   Past Medical History:  Diagnosis Date  . Atrial fibrillation ([place])   . Gout   . Hyperlipidemia     Patient Active Problem List   Diagnosis Date Noted  . Effusion of right knee 05/16/2017  . Atrial fibrillation, new onset ([place]) 02/15/2015    Past Surgical History:  Procedure Laterality Date  . INGUINAL HERNIA REPAIR Bilateral   . SEPTOPLASTY    . SHOULDER ARTHROSCOPY    . TONSILLECTOMY         Family History  Problem Relation Age of Onset  . Hypertension Mother   . Diabetes Father   . CAD Father 20       CABG, arrhythmia with ablation (no details)   . Hypertension Father   . Diabetes Maternal Grandmother   . Hypertension Maternal Grandmother   . Diabetes Maternal Grandfather   . Heart attack Maternal Grandfather   . Hypertension Maternal Grandfather   . Fainting Maternal Grandfather   . Diabetes Paternal Grandmother   . Hypertension Paternal Grandmother   . Fainting Paternal Grandmother   . Diabetes Paternal Grandfather   . Heart attack Paternal Grandfather   . Hypertension Paternal Grandfather   . Hypertension Brother   . Colon cancer Neg Hx   . Esophageal cancer Neg Hx   . Rectal cancer Neg Hx   . Stomach cancer Neg Hx     Social History   Tobacco Use  . Smoking status: Never Smoker  . Smokeless tobacco: Never Used  Substance Use Topics  . Alcohol use: Yes    Comment: weekly  . Drug use: No    Home Medications Prior to  Admission medications   Medication Sig Start Date End Date Taking? Authorizing Provider  allopurinol (ZYLOPRIM) 300 MG tablet Take 1 tablet by mouth daily. Take 1 tab daily 02/07/15   [provider]  aspirin 81 MG tablet Take 81 mg by mouth daily.    [provider]  atorvastatin (LIPITOR) 10 MG tablet Take 1 tablet by mouth daily. Take 1 tab daily 11/29/14   [provider]  flecainide (TAMBOCOR) 100 MG tablet TAKE 1 TABLET (100 MG TOTAL) BY MOUTH 2 (TWO) TIMES DAILY. NEED OV. 11/16/16   Minus Breeding, MD  ibuprofen (ADVIL,MOTRIN) 800 MG tablet Take 1 tablet (800 mg total) by mouth every 8 (eight) hours as needed. Patient not taking: Reported on 10/29/2017 05/08/17   Long, Wonda Olds, MD  metoprolol succinate (TOPROL-XL) 25 MG 24 hr tablet Take 1 tablet (25 mg total) by mouth daily. 06/03/15   Minus Breeding, MD  Probiotic Product (PROBIOTIC DAILY PO) Take 1 tablet by mouth daily.    [provider]    Allergies    Patient has no known allergies.  Review of Systems   Review of Systems  Respiratory: Negative for shortness of breath.   Cardiovascular: Positive for chest pain.  Gastrointestinal: Negative for abdominal pain.  Physical Exam Updated Vital Signs BP (!) 145/94 (BP Location: Right Arm)   Pulse (!) 58   Temp 98.3 F (36.8 C) (Oral)   Resp 18   Ht 6\' 4"  (1.93 m)   Wt 105.2 kg   SpO2 97%   BMI 28.24 kg/m   Physical Exam Vitals and nursing note reviewed.  Constitutional:      Appearance: He is well-developed.  HENT:     Head: Normocephalic and atraumatic.     Right Ear: External ear normal.     Left Ear: External ear normal.     Nose: Nose normal.  Eyes:     General:        Right eye: No discharge.        Left eye: No discharge.  Cardiovascular:     Rate and Rhythm: Normal rate and regular rhythm.     Heart sounds: Normal heart sounds.  Pulmonary:     Effort: Pulmonary effort is normal.     Breath sounds: Normal breath  sounds.  Chest:     Chest wall: Tenderness present.    Abdominal:     General: There is no distension.     Palpations: Abdomen is soft.     Tenderness: There is no abdominal tenderness.  Musculoskeletal:     Cervical back: Neck supple.  Skin:    General: Skin is warm and dry.  Neurological:     Mental Status: He is alert.  Psychiatric:        Mood and Affect: Mood is not anxious.     ED Results / Procedures / Treatments   Labs (all labs ordered are listed, but only abnormal results are displayed) Labs Reviewed - No data to display  EKG None  Radiology DG Ribs Unilateral W/Chest Left  Result Date: 05/07/2019 CLINICAL DATA:  Rib injury EXAM: LEFT RIBS AND CHEST - 3+ VIEW COMPARISON:  None. FINDINGS: Single-view chest demonstrates no focal opacity or pleural effusion. Normal heart size. No pneumothorax. Left rib series demonstrates an acute mildly displaced left ninth anterolateral rib fracture. IMPRESSION: 1. Negative for pneumothorax or pleural effusion 2. Acute left ninth rib fracture Electronically Signed   By: Donavan Foil M.D.   On: 05/07/2019 18:34    Procedures Procedures (including critical care time)  Medications Ordered in ED Medications - No data to display  ED Course  I have reviewed the triage vital signs and the nursing notes.  Pertinent labs & imaging results that were available during my care of the patient were reviewed by me and considered in my medical decision making (see chart for details).    MDM Rules/Calculators/A&P                      X-ray confirms rib fracture.  No pneumothorax or other acute complication.  He declines anything for pain and will take ibuprofen at home.  Is several days out in this would be pretty reasonable.  Appears stable for discharge home. Final Clinical Impression(s) / ED Diagnoses Final diagnoses:  Closed fracture of one rib of left side, initial encounter    Rx / DC Orders ED Discharge Orders    None         Sherwood Gambler, MD 05/07/19 [phone]

## 2019-05-20 DIAGNOSIS — R739 Hyperglycemia, unspecified: Secondary | ICD-10-CM | POA: Diagnosis not present

## 2019-05-22 DIAGNOSIS — N486 Induration penis plastica: Secondary | ICD-10-CM | POA: Diagnosis not present

## 2019-06-08 DIAGNOSIS — H029 Unspecified disorder of eyelid: Secondary | ICD-10-CM | POA: Diagnosis not present

## 2019-06-08 DIAGNOSIS — H2513 Age-related nuclear cataract, bilateral: Secondary | ICD-10-CM | POA: Diagnosis not present

## 2019-06-08 DIAGNOSIS — H1045 Other chronic allergic conjunctivitis: Secondary | ICD-10-CM | POA: Diagnosis not present

## 2019-06-09 DIAGNOSIS — N486 Induration penis plastica: Secondary | ICD-10-CM | POA: Diagnosis not present

## 2019-06-11 DIAGNOSIS — N486 Induration penis plastica: Secondary | ICD-10-CM | POA: Diagnosis not present

## 2019-08-11 DIAGNOSIS — N486 Induration penis plastica: Secondary | ICD-10-CM | POA: Diagnosis not present

## 2019-08-13 DIAGNOSIS — N486 Induration penis plastica: Secondary | ICD-10-CM | POA: Diagnosis not present

## 2019-08-14 DIAGNOSIS — N486 Induration penis plastica: Secondary | ICD-10-CM | POA: Diagnosis not present

## 2019-11-02 DIAGNOSIS — Z20822 Contact with and (suspected) exposure to covid-19: Secondary | ICD-10-CM | POA: Diagnosis not present

## 2020-02-08 DIAGNOSIS — L853 Xerosis cutis: Secondary | ICD-10-CM | POA: Diagnosis not present

## 2020-02-08 DIAGNOSIS — X32XXXS Exposure to sunlight, sequela: Secondary | ICD-10-CM | POA: Diagnosis not present

## 2020-02-08 DIAGNOSIS — L814 Other melanin hyperpigmentation: Secondary | ICD-10-CM | POA: Diagnosis not present

## 2020-02-08 DIAGNOSIS — D3610 Benign neoplasm of peripheral nerves and autonomic nervous system, unspecified: Secondary | ICD-10-CM | POA: Diagnosis not present

## 2020-04-22 DIAGNOSIS — Z20822 Contact with and (suspected) exposure to covid-19: Secondary | ICD-10-CM | POA: Diagnosis not present

## 2020-05-04 DIAGNOSIS — M109 Gout, unspecified: Secondary | ICD-10-CM | POA: Diagnosis not present

## 2020-05-04 DIAGNOSIS — Z125 Encounter for screening for malignant neoplasm of prostate: Secondary | ICD-10-CM | POA: Diagnosis not present

## 2020-05-04 DIAGNOSIS — R739 Hyperglycemia, unspecified: Secondary | ICD-10-CM | POA: Diagnosis not present

## 2020-05-04 DIAGNOSIS — E7800 Pure hypercholesterolemia, unspecified: Secondary | ICD-10-CM | POA: Diagnosis not present

## 2020-05-04 DIAGNOSIS — Z0000 Encounter for general adult medical examination without abnormal findings: Secondary | ICD-10-CM | POA: Diagnosis not present

## 2020-05-15 DIAGNOSIS — Z20822 Contact with and (suspected) exposure to covid-19: Secondary | ICD-10-CM | POA: Diagnosis not present

## 2020-05-15 DIAGNOSIS — Z03818 Encounter for observation for suspected exposure to other biological agents ruled out: Secondary | ICD-10-CM | POA: Diagnosis not present

## 2020-05-20 DIAGNOSIS — R82998 Other abnormal findings in urine: Secondary | ICD-10-CM | POA: Diagnosis not present

## 2020-05-20 DIAGNOSIS — E7800 Pure hypercholesterolemia, unspecified: Secondary | ICD-10-CM | POA: Diagnosis not present

## 2020-05-20 DIAGNOSIS — Z1331 Encounter for screening for depression: Secondary | ICD-10-CM | POA: Diagnosis not present

## 2020-05-20 DIAGNOSIS — Z0000 Encounter for general adult medical examination without abnormal findings: Secondary | ICD-10-CM | POA: Diagnosis not present

## 2020-05-20 DIAGNOSIS — Z1339 Encounter for screening examination for other mental health and behavioral disorders: Secondary | ICD-10-CM | POA: Diagnosis not present

## 2020-05-20 DIAGNOSIS — R739 Hyperglycemia, unspecified: Secondary | ICD-10-CM | POA: Diagnosis not present

## 2020-05-25 DIAGNOSIS — Z20822 Contact with and (suspected) exposure to covid-19: Secondary | ICD-10-CM | POA: Diagnosis not present

## 2020-06-10 DIAGNOSIS — Z20822 Contact with and (suspected) exposure to covid-19: Secondary | ICD-10-CM | POA: Diagnosis not present

## 2021-01-31 ENCOUNTER — Ambulatory Visit (INDEPENDENT_AMBULATORY_CARE_PROVIDER_SITE_OTHER): Payer: BC Managed Care – PPO | Admitting: Family Medicine

## 2021-01-31 ENCOUNTER — Encounter: Payer: Self-pay | Admitting: Family Medicine

## 2021-01-31 ENCOUNTER — Other Ambulatory Visit (HOSPITAL_BASED_OUTPATIENT_CLINIC_OR_DEPARTMENT_OTHER): Payer: Self-pay

## 2021-01-31 VITALS — BP 112/70 | Ht 76.0 in | Wt 232.0 lb

## 2021-01-31 DIAGNOSIS — M7502 Adhesive capsulitis of left shoulder: Secondary | ICD-10-CM | POA: Diagnosis not present

## 2021-01-31 MED ORDER — PREDNISONE 5 MG PO TABS
ORAL_TABLET | ORAL | 0 refills | Status: DC
  Filled 2021-01-31: qty 21, 6d supply, fill #0

## 2021-01-31 MED ORDER — PREDNISONE 5 MG PO TABS: ORAL_TABLET | ORAL | 0 refills | Status: DC

## 2021-01-31 NOTE — Progress Notes (Signed)
  John Hancock - 60 y.o. male MRN [id number]  Date of birth: [birth year]  SUBJECTIVE:  Including CC & ROS.  No chief complaint on file.   John Hancock is a 60 y.o. male that is presenting with acute on chronic left shoulder pain.  The pain is worse with rotation externally.  Denies any specific injury.  Can do flexion with no problems.    Review of Systems See HPI   HISTORY: Past Medical, Surgical, Social, and Family History Reviewed & Updated per EMR.   Pertinent Historical Findings include:  Past Medical History:  Diagnosis Date   Atrial fibrillation ([place])    Gout    Hyperlipidemia     Past Surgical History:  Procedure Laterality Date   INGUINAL HERNIA REPAIR Bilateral    SEPTOPLASTY     SHOULDER ARTHROSCOPY     TONSILLECTOMY      Family History  Problem Relation Age of Onset   Hypertension Mother    Diabetes Father    CAD Father 85       CABG, arrhythmia with ablation (no details)    Hypertension Father    Diabetes Maternal Grandmother    Hypertension Maternal Grandmother    Diabetes Maternal Grandfather    Heart attack Maternal Grandfather    Hypertension Maternal Grandfather    Fainting Maternal Grandfather    Diabetes Paternal Grandmother    Hypertension Paternal Grandmother    Fainting Paternal Grandmother    Diabetes Paternal Grandfather    Heart attack Paternal Grandfather    Hypertension Paternal Grandfather    Hypertension Brother    Colon cancer Neg Hx    Esophageal cancer Neg Hx    Rectal cancer Neg Hx    Stomach cancer Neg Hx     Social History   Socioeconomic History   Marital status: Married    Spouse name: Not on file   Number of children: 2   Years of education: Not on file   Highest education level: Not on file  Occupational History   Not on file  Tobacco Use   Smoking status: Never   Smokeless tobacco: Never  Vaping Use   Vaping Use: Never used  Substance and Sexual Activity   Alcohol use: Yes    Comment: weekly   Drug use:  No   Sexual activity: Not on file  Other Topics Concern   Not on file  Social History Narrative   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     Social Determinants of Health   Financial Resource Strain: Not on file  Food Insecurity: Not on file  Transportation Needs: Not on file  Physical Activity: Not on file  Stress: Not on file  Social Connections: Not on file  Intimate Partner Violence: Not on file     PHYSICAL EXAM:  VS: BP 112/70 (BP Location: Left Arm, Patient Position: Sitting)   Ht 6\' 4"  (1.93 m)   Wt 232 lb (105.2 kg)   BMI 28.24 kg/m  Physical Exam Gen: NAD, alert, cooperative with exam, well-appearing     ASSESSMENT & PLAN:   Adhesive capsulitis of left shoulder Acute on chronic in nature.  Lacks external rotation compared to contralateral side. -Counseled on home exercise therapy and supportive care. -Prednisone. -Could consider injection, imaging or physical therapy.

## 2021-01-31 NOTE — Assessment & Plan Note (Signed)
Acute on chronic in nature.  Lacks external rotation compared to contralateral side. -Counseled on home exercise therapy and supportive care. -Prednisone. -Could consider injection, imaging or physical therapy.

## 2021-01-31 NOTE — Patient Instructions (Signed)
Nice to meet you Please try heat before exercise and ice after Please try the exercises   Please send me a message in MyChart with any questions or updates.  Please see me back in 4 weeks.   --Dr. Ezmae Speers  

## 2021-01-31 NOTE — Addendum Note (Signed)
Addended by: Cresenciano Lick on: 01/31/2021 03:03 PM   Modules accepted: Orders

## 2021-03-02 ENCOUNTER — Ambulatory Visit: Payer: BC Managed Care – PPO | Attending: Internal Medicine

## 2021-03-02 ENCOUNTER — Other Ambulatory Visit (HOSPITAL_BASED_OUTPATIENT_CLINIC_OR_DEPARTMENT_OTHER): Payer: Self-pay

## 2021-03-02 DIAGNOSIS — Z23 Encounter for immunization: Secondary | ICD-10-CM

## 2021-03-02 MED ORDER — FLUARIX QUADRIVALENT 0.5 ML IM SUSY
PREFILLED_SYRINGE | INTRAMUSCULAR | 0 refills | Status: AC
  Filled 2021-03-02: qty 0.5, 1d supply, fill #0

## 2021-03-02 NOTE — Progress Notes (Signed)
° °  Covid-19 Vaccination Clinic  Name:  John Hancock    MRN: [id number] DOB: 11-30-1960  03/02/2021  [name] was observed post Covid-19 immunization for 15 minutes without incident. He was provided with Vaccine Information Sheet and instruction to access the V-Safe system.   [name] was instructed to call 911 with any severe reactions post vaccine: Difficulty breathing  Swelling of face and throat  A fast heartbeat  A bad rash all over body  Dizziness and weakness   Immunizations Administered     Name Date Dose VIS Date Route   Pfizer Covid-19 Vaccine Bivalent Booster 03/02/2021  1:07 PM 0.3 mL 11/09/2020 Intramuscular   Manufacturer: [place]   Lot: [id number]   [place]: [phone]

## 2021-03-03 ENCOUNTER — Other Ambulatory Visit (HOSPITAL_BASED_OUTPATIENT_CLINIC_OR_DEPARTMENT_OTHER): Payer: Self-pay

## 2021-03-03 MED ORDER — PFIZER COVID-19 VAC BIVALENT 30 MCG/0.3ML IM SUSP
INTRAMUSCULAR | 0 refills | Status: DC
  Filled 2021-03-03: qty 0.3, 1d supply, fill #0

## 2021-03-07 ENCOUNTER — Ambulatory Visit (INDEPENDENT_AMBULATORY_CARE_PROVIDER_SITE_OTHER): Payer: BC Managed Care – PPO | Admitting: Family Medicine

## 2021-03-07 DIAGNOSIS — M7502 Adhesive capsulitis of left shoulder: Secondary | ICD-10-CM

## 2021-03-07 NOTE — Progress Notes (Signed)
°  Gurshan Settlemire - 60 y.o. male MRN [id number]  Date of birth: 1960-10-04  SUBJECTIVE:  Including CC & ROS.  No chief complaint on file.   Jedd Schulenburg is a 60 y.o. male that is following up for his left shoulder pain.  He has done well with home exercise regimen.  Has improved his function and pain.   Review of Systems See HPI   HISTORY: Past Medical, Surgical, Social, and Family History Reviewed & Updated per EMR.   Pertinent Historical Findings include:  Past Medical History:  Diagnosis Date   Atrial fibrillation ([place])    Gout    Hyperlipidemia     Past Surgical History:  Procedure Laterality Date   INGUINAL HERNIA REPAIR Bilateral    SEPTOPLASTY     SHOULDER ARTHROSCOPY     TONSILLECTOMY      Family History  Problem Relation Age of Onset   Hypertension Mother    Diabetes Father    CAD Father 41       CABG, arrhythmia with ablation (no details)    Hypertension Father    Diabetes Maternal Grandmother    Hypertension Maternal Grandmother    Diabetes Maternal Grandfather    Heart attack Maternal Grandfather    Hypertension Maternal Grandfather    Fainting Maternal Grandfather    Diabetes Paternal Grandmother    Hypertension Paternal Grandmother    Fainting Paternal Grandmother    Diabetes Paternal Grandfather    Heart attack Paternal Grandfather    Hypertension Paternal Grandfather    Hypertension Brother    Colon cancer Neg Hx    Esophageal cancer Neg Hx    Rectal cancer Neg Hx    Stomach cancer Neg Hx     Social History   Socioeconomic History   Marital status: Married    Spouse name: Not on file   Number of children: 2   Years of education: Not on file   Highest education level: Not on file  Occupational History   Not on file  Tobacco Use   Smoking status: Never   Smokeless tobacco: Never  Vaping Use   Vaping Use: Never used  Substance and Sexual Activity   Alcohol use: Yes    Comment: weekly   Drug use: No   Sexual activity: Not on file   Other Topics Concern   Not on file  Social History Narrative   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     Social Determinants of Health   Financial Resource Strain: Not on file  Food Insecurity: Not on file  Transportation Needs: Not on file  Physical Activity: Not on file  Stress: Not on file  Social Connections: Not on file  Intimate Partner Violence: Not on file     PHYSICAL EXAM:  VS: BP 120/80    Ht 6\' 4"  (1.93 m)    Wt 240 lb (108.9 kg)    BMI 29.21 kg/m  Physical Exam Gen: NAD, alert, cooperative with exam, well-appearing    ASSESSMENT & PLAN:   Adhesive capsulitis of left shoulder Significant improvement with measures thus far -Counseled on home exercise therapy and supportive care. -Could consider physical therapy.

## 2021-03-07 NOTE — Assessment & Plan Note (Signed)
Significant improvement with measures thus far -Counseled on home exercise therapy and supportive care. -Could consider physical therapy.

## 2021-04-20 DIAGNOSIS — Z20822 Contact with and (suspected) exposure to covid-19: Secondary | ICD-10-CM | POA: Diagnosis not present

## 2021-05-29 DIAGNOSIS — E7800 Pure hypercholesterolemia, unspecified: Secondary | ICD-10-CM | POA: Diagnosis not present

## 2021-05-29 DIAGNOSIS — M109 Gout, unspecified: Secondary | ICD-10-CM | POA: Diagnosis not present

## 2021-05-29 DIAGNOSIS — Z125 Encounter for screening for malignant neoplasm of prostate: Secondary | ICD-10-CM | POA: Diagnosis not present

## 2021-05-29 DIAGNOSIS — R739 Hyperglycemia, unspecified: Secondary | ICD-10-CM | POA: Diagnosis not present

## 2021-06-05 DIAGNOSIS — R82998 Other abnormal findings in urine: Secondary | ICD-10-CM | POA: Diagnosis not present

## 2021-06-05 DIAGNOSIS — Z1339 Encounter for screening examination for other mental health and behavioral disorders: Secondary | ICD-10-CM | POA: Diagnosis not present

## 2021-06-05 DIAGNOSIS — Z0000 Encounter for general adult medical examination without abnormal findings: Secondary | ICD-10-CM | POA: Diagnosis not present

## 2021-06-05 DIAGNOSIS — Z1331 Encounter for screening for depression: Secondary | ICD-10-CM | POA: Diagnosis not present

## 2021-06-05 DIAGNOSIS — R739 Hyperglycemia, unspecified: Secondary | ICD-10-CM | POA: Diagnosis not present

## 2021-06-05 DIAGNOSIS — Z1212 Encounter for screening for malignant neoplasm of rectum: Secondary | ICD-10-CM | POA: Diagnosis not present

## 2021-07-19 DIAGNOSIS — L570 Actinic keratosis: Secondary | ICD-10-CM | POA: Diagnosis not present

## 2021-07-19 DIAGNOSIS — D485 Neoplasm of uncertain behavior of skin: Secondary | ICD-10-CM | POA: Diagnosis not present

## 2021-07-19 DIAGNOSIS — C44519 Basal cell carcinoma of skin of other part of trunk: Secondary | ICD-10-CM | POA: Diagnosis not present

## 2021-07-19 DIAGNOSIS — D3617 Benign neoplasm of peripheral nerves and autonomic nervous system of trunk, unspecified: Secondary | ICD-10-CM | POA: Diagnosis not present

## 2021-09-18 DIAGNOSIS — C44519 Basal cell carcinoma of skin of other part of trunk: Secondary | ICD-10-CM | POA: Diagnosis not present

## 2021-10-01 IMAGING — DX DG RIBS W/ CHEST 3+V*L*
3 series · 3 of 3 positions shown · non-contrast
Comparison: None.

CLINICAL DATA: Rib injury

EXAM:
LEFT RIBS AND CHEST - 3+ VIEW

[chest pa]
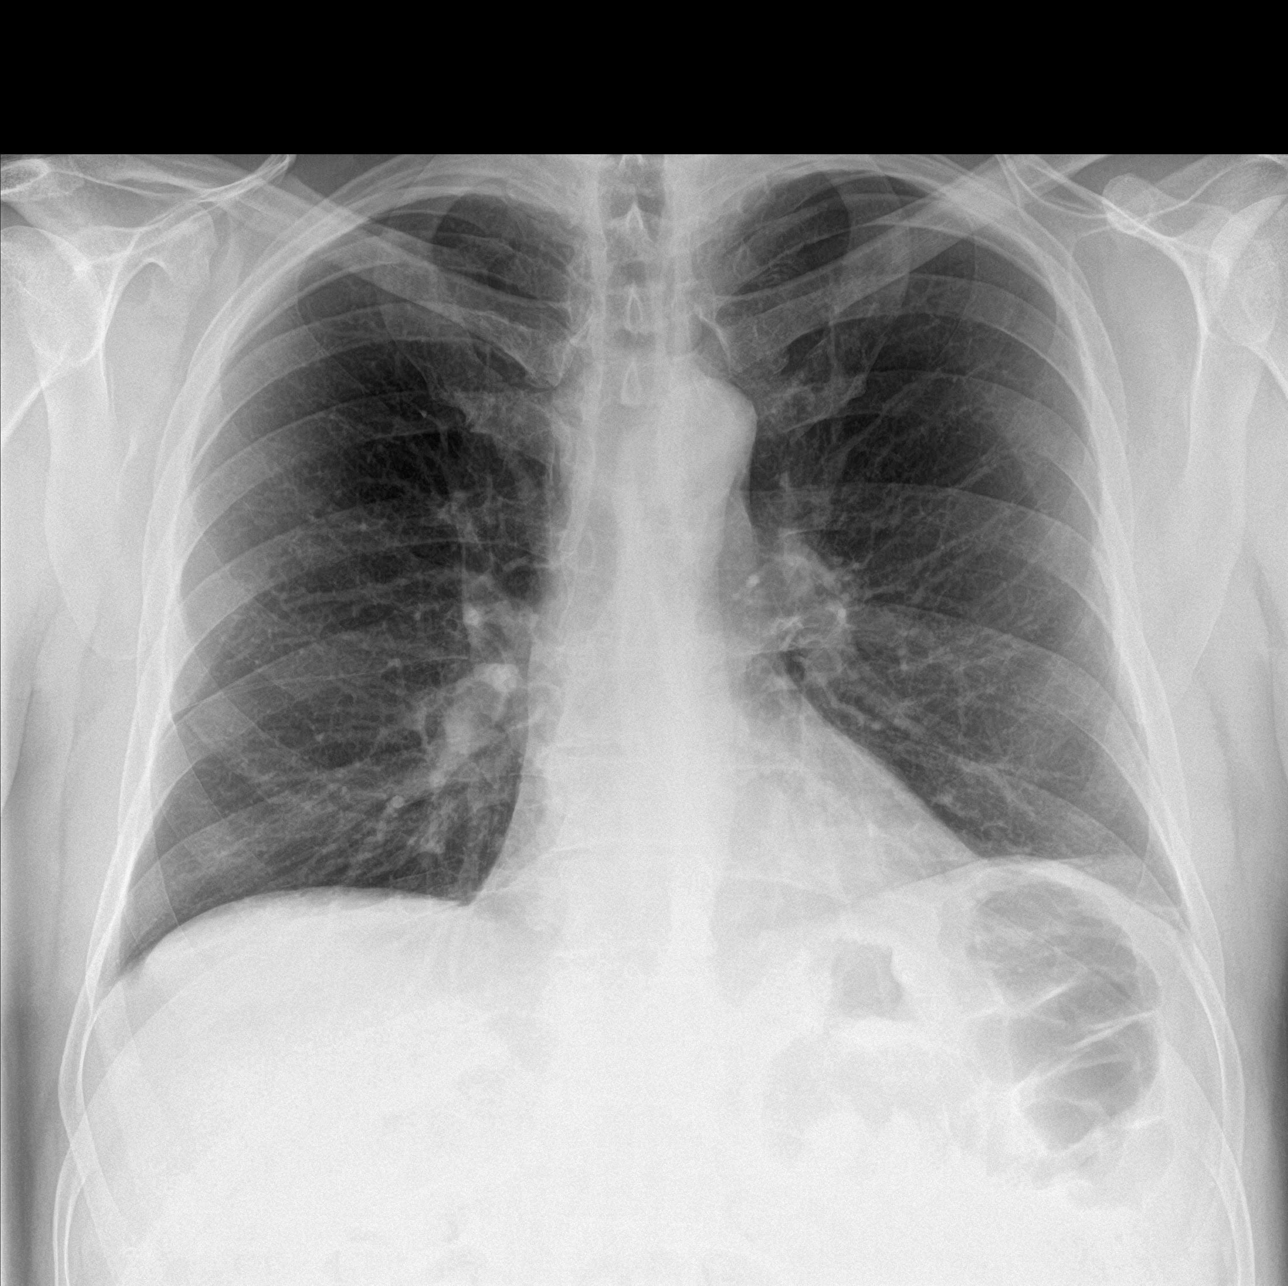

[rib pa obl]
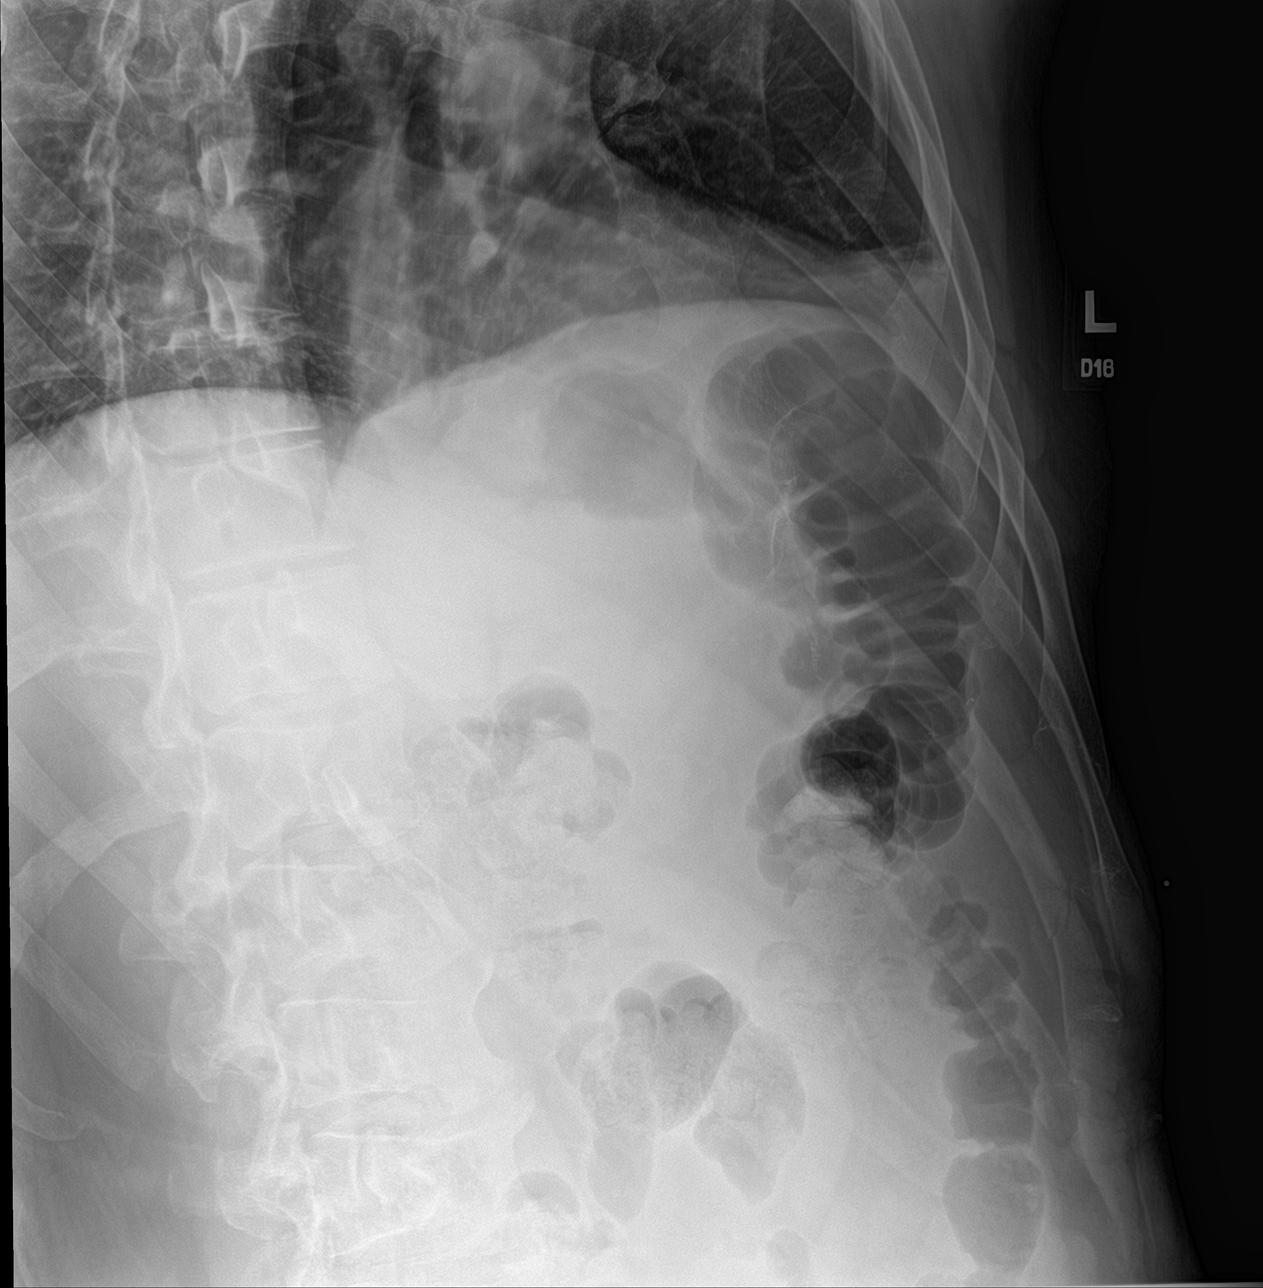

[rib pa]
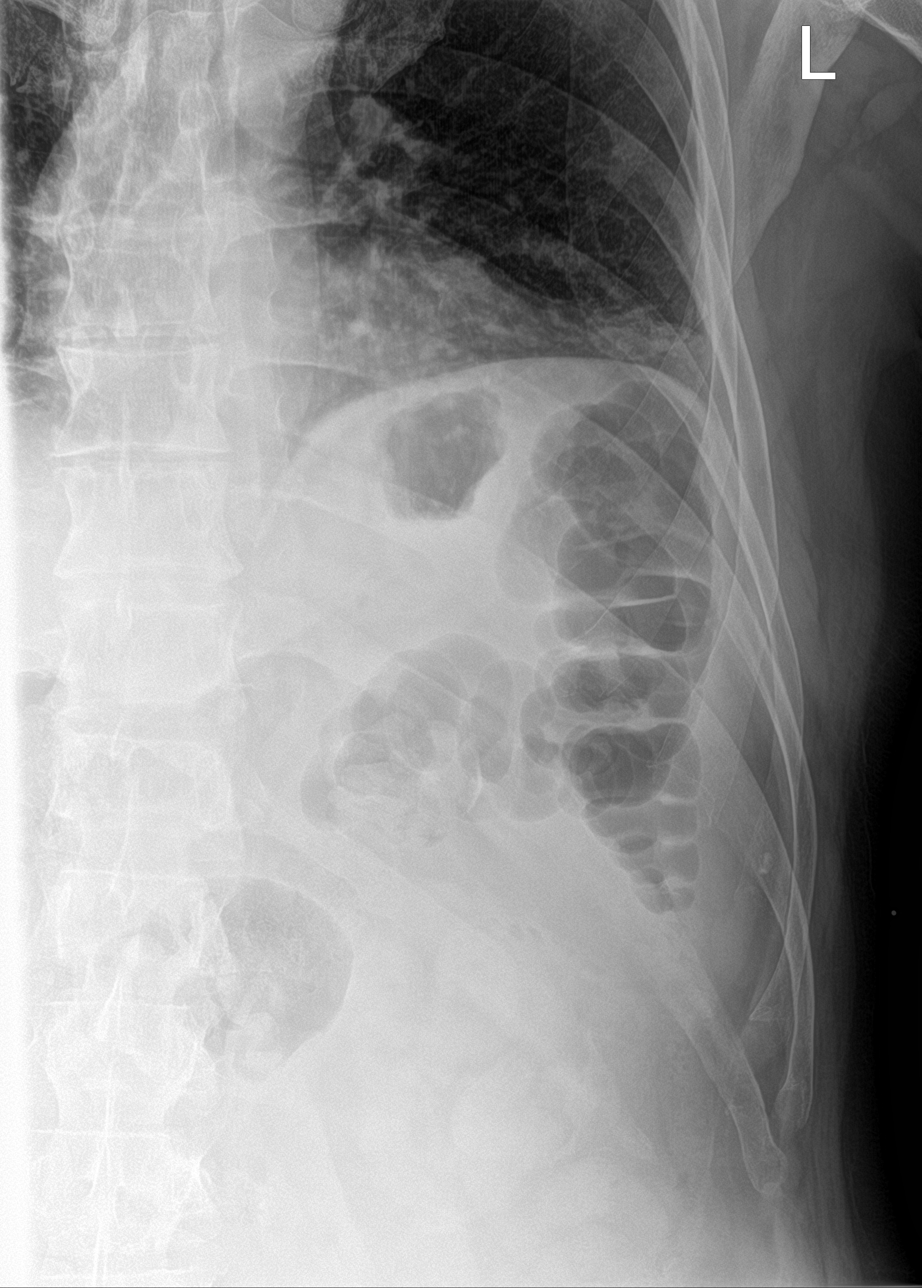

[3 of 3 positions shown; findings below may reference images not displayed]

FINDINGS: Single-view chest demonstrates no focal opacity or pleural effusion.
Normal heart size. No pneumothorax.

Left rib series demonstrates an acute mildly displaced left ninth
anterolateral rib fracture.
IMPRESSION: 1. Negative for pneumothorax or pleural effusion
2. Acute left ninth rib fracture

## 2022-05-30 DIAGNOSIS — L814 Other melanin hyperpigmentation: Secondary | ICD-10-CM | POA: Diagnosis not present

## 2022-05-30 DIAGNOSIS — X32XXXS Exposure to sunlight, sequela: Secondary | ICD-10-CM | POA: Diagnosis not present

## 2022-05-30 DIAGNOSIS — L570 Actinic keratosis: Secondary | ICD-10-CM | POA: Diagnosis not present

## 2022-06-22 DIAGNOSIS — Z125 Encounter for screening for malignant neoplasm of prostate: Secondary | ICD-10-CM | POA: Diagnosis not present

## 2022-06-22 DIAGNOSIS — M109 Gout, unspecified: Secondary | ICD-10-CM | POA: Diagnosis not present

## 2022-06-22 DIAGNOSIS — R739 Hyperglycemia, unspecified: Secondary | ICD-10-CM | POA: Diagnosis not present

## 2022-06-22 DIAGNOSIS — Z1212 Encounter for screening for malignant neoplasm of rectum: Secondary | ICD-10-CM | POA: Diagnosis not present

## 2022-06-22 DIAGNOSIS — R7989 Other specified abnormal findings of blood chemistry: Secondary | ICD-10-CM | POA: Diagnosis not present

## 2022-06-22 DIAGNOSIS — E7800 Pure hypercholesterolemia, unspecified: Secondary | ICD-10-CM | POA: Diagnosis not present

## 2022-06-27 ENCOUNTER — Encounter: Payer: Self-pay | Admitting: *Deleted

## 2022-06-28 DIAGNOSIS — R82998 Other abnormal findings in urine: Secondary | ICD-10-CM | POA: Diagnosis not present

## 2022-06-28 DIAGNOSIS — Z0000 Encounter for general adult medical examination without abnormal findings: Secondary | ICD-10-CM | POA: Diagnosis not present

## 2022-06-28 DIAGNOSIS — I480 Paroxysmal atrial fibrillation: Secondary | ICD-10-CM | POA: Diagnosis not present

## 2022-06-28 DIAGNOSIS — Z1331 Encounter for screening for depression: Secondary | ICD-10-CM | POA: Diagnosis not present

## 2022-06-28 DIAGNOSIS — Z1339 Encounter for screening examination for other mental health and behavioral disorders: Secondary | ICD-10-CM | POA: Diagnosis not present

## 2022-06-28 DIAGNOSIS — R739 Hyperglycemia, unspecified: Secondary | ICD-10-CM | POA: Diagnosis not present

## 2022-06-28 DIAGNOSIS — I519 Heart disease, unspecified: Secondary | ICD-10-CM | POA: Diagnosis not present

## 2022-06-28 DIAGNOSIS — E7800 Pure hypercholesterolemia, unspecified: Secondary | ICD-10-CM | POA: Diagnosis not present

## 2022-08-21 ENCOUNTER — Telehealth: Payer: Self-pay | Admitting: Cardiology

## 2022-08-21 NOTE — Telephone Encounter (Signed)
Scheduler called stating pt's wife is on the line reporting pt is experiencing chest discomfort that feels like indigestion.  Per chart review, pt last seen by our office in 2012. Based on protocol, pt is considered a new pt and will have to re-establish care. Scheduler made pt an appointment for 6/13.  Nurse informed wife of protocol and advise pt to report to ER or Urgent Care if currently experiencing symptoms.  Wife verbalized understanding.

## 2022-08-21 NOTE — Telephone Encounter (Signed)
New Message:     Patient is an old patient, last seen 7 years ago)(2017). He will be see Dr Herbie Baltimore on 08-23-22 as a new patient.    Pt c/o of Chest Pain: STAT if CP now or developed within 24 hours  1. Are you having CP right now? Yes it feels like indigestion  2. Are you experiencing any other symptoms (ex. SOB, nausea, vomiting, sweating)?  no  3. How long have you been experiencing CP? About 2 weeks  4. Is your CP continuous or coming and going? Comes and goes  5. Have you taken Nitroglycerin? No- patient has an appointment on 02-22-23- please call to evaluate ?

## 2022-08-23 ENCOUNTER — Encounter: Payer: Self-pay | Admitting: Cardiology

## 2022-08-23 ENCOUNTER — Ambulatory Visit: Payer: BC Managed Care – PPO | Attending: Cardiology | Admitting: Cardiology

## 2022-08-23 VITALS — BP 128/80 | HR 55 | Ht 76.0 in | Wt 240.5 lb

## 2022-08-23 DIAGNOSIS — R0789 Other chest pain: Secondary | ICD-10-CM | POA: Diagnosis not present

## 2022-08-23 DIAGNOSIS — I480 Paroxysmal atrial fibrillation: Secondary | ICD-10-CM | POA: Diagnosis not present

## 2022-08-23 DIAGNOSIS — K219 Gastro-esophageal reflux disease without esophagitis: Secondary | ICD-10-CM | POA: Insufficient documentation

## 2022-08-23 NOTE — Patient Instructions (Addendum)
Medication Instructions:  No changes at this time.   *If you need a refill on your cardiac medications before your next appointment, please call your pharmacy*   Lab Work: None  If you have labs (blood work) drawn today and your tests are completely normal, you will receive your results only by: MyChart Message (if you have MyChart) OR A paper copy in the mail If you have any lab test that is abnormal or we need to change your treatment, we will call you to review the results.   Testing/Procedures: None   Follow-Up: At [facility], you and your health needs are our priority.  As part of our continuing mission to provide you with exceptional heart care, we have created designated Provider Care Teams.  These Care Teams include your primary Cardiologist (physician) and Advanced Practice Providers (APPs -  Physician Assistants and Nurse Practitioners) who all work together to provide you with the care you need, when you need it.  We recommend signing up for the patient portal called "MyChart".  Sign up information is provided on this After Visit Summary.  MyChart is used to connect with patients for Virtual Visits (Telemedicine).  Patients are able to view lab/test results, encounter notes, upcoming appointments, etc.  Non-urgent messages can be sent to your provider as well.   To learn more about what you can do with MyChart, go to [email or web address].    Your next appointment:   4 month(s)  Provider:   You may see Dr. Herbie Baltimore or one of the following Advanced Practice Providers on your designated Care Team:   Nicolasa Ducking, NP Eula Listen, PA-C Cadence Fransico Michael, PA-C Charlsie Quest, NP

## 2022-08-23 NOTE — Assessment & Plan Note (Signed)
Somewhat atypical sounding symptoms of fullness in his chest/upper abdomen Medicare happens with or without exertion, but oftentimes happens in the day.  Somewhat atypical from a angina standpoint.  I do not think that this is likely cardiac in nature does not sound that is A-fib related.  It does sound good probably is related to his GERD because he notes improvement with every time he takes Pepcid, and has not been better with rest or exertion.  I think is probably best for him to continue with his GI evaluation, there was a consideration of doing EGD as well as his routine screening colonoscopy to reassess GERD.  For now would hold off on anticoagulation to be assured that he does not have any active issues in his GI evaluation.

## 2022-08-23 NOTE — Assessment & Plan Note (Addendum)
Very infrequent breakthrough spells-May be once twice a month if that, and only lasts less than an hour or 2.  Not all that symptomatic.  Otherwise pretty well-controlled on current dose of flecainide and Toprol.  He takes 81 mg aspirin for CVA prophylaxis.  CHA2DS2-VASc score 0. As of next year, will need to consider the possibility of DOAC.  Would not want any more anticoagulation until we have his GI issues resolved confirming no potential significant bleeding lesion.   CHA2DS2-VASc Score = 0   This indicates a 0.2% annual risk of stroke. The patient's score is based upon: CHF History: 0 HTN History: 0 Diabetes History: 0 Stroke History: 0 Vascular Disease History: 0 Age Score: 0 Gender Score: 0

## 2022-08-23 NOTE — Progress Notes (Signed)
Primary Care Provider: Tisovec, Adelfa Koh, MD [facility] Cardiologist: None Electrophysiologist: None  Clinic Note: Chief Complaint  Patient presents with   New Patient (Initial Visit)    Establish care for chest pain/indigestion; symptoms x 4-5 weeks. Medications reviewed by the patient verbally.     ===================================  ASSESSMENT/PLAN   Problem List Items Addressed This Visit       Cardiology Problems   Paroxysmal atrial fibrillation (HCC) - Primary (Chronic)    Very infrequent breakthrough spells-May be once twice a month if that, and only lasts less than an hour or 2.  Not all that symptomatic.  Otherwise pretty well-controlled on current dose of flecainide and Toprol.  He takes 81 mg aspirin for CVA prophylaxis.  CHA2DS2-VASc score 0. As of next year, will need to consider the possibility of DOAC.  Would not want any more anticoagulation until we have his GI issues resolved confirming no potential significant bleeding lesion.   CHA2DS2-VASc Score = 0   This indicates a 0.2% annual risk of stroke. The patient's score is based upon: CHF History: 0 HTN History: 0 Diabetes History: 0 Stroke History: 0 Vascular Disease History: 0 Age Score: 0 Gender Score: 0            Relevant Medications   tadalafil (CIALIS) 5 MG tablet   Other Relevant Orders   EKG 12-Lead     Other   Discomfort in chest    Somewhat atypical sounding symptoms of fullness in his chest/upper abdomen Medicare happens with or without exertion, but oftentimes happens in the day.  Somewhat atypical from a angina standpoint.  I do not think that this is likely cardiac in nature does not sound that is A-fib related.  It does sound good probably is related to his GERD because he notes improvement with every time he takes Pepcid, and has not been better with rest or exertion.  I think is probably best for him to continue with his GI evaluation, there was a consideration of  doing EGD as well as his routine screening colonoscopy to reassess GERD.  For now would hold off on anticoagulation to be assured that he does not have any active issues in his GI evaluation.       ===================================  HPI:    John Hancock is a 62 y.o. male with PMH notable for Paroxysmal A-fib who is being seen today for the evaluation of CHEST DISCOMFORT at the request of Tisovec, Adelfa Koh, MD.  Joanne Nosbisch was last seen in the A-fib Clinic on 08/03/2015 by Rudi Coco, NP.  [place] started on flecainide 100 mg twice daily by Dr. Antoine Poche.  This visit was for recurrent palpitations.  Was in sinus rhythm during evaluation.  No regular exercise (5 mile long couple days a week recently retired.  Felt that flecainide was not necessarily working.  Decided to continue with second hide and use additional doses of metoprolol as needed.  Recent Hospitalizations: None  Reviewed  CV studies:    The following studies were reviewed today: (if available, images/films reviewed: From Epic Chart or Care Everywhere) TTE 02/16/2015: Normal LV size and function.  EF 55 to 60%.  No RWMA.  GR 1 DD.  Otherwise normal valves.  Normal atrial size. ETT 04/05/2015: Able to walk 14:15 mion prior to stopping because of fatigue.  Normal BP and HR response.  Excellent exercise capacity.  Achieved max heart rate after 12 minutes.  17.3 METS.  128% MPHR.  (Peak  HR 210 bpm) Post flecainide ETT recent 83% MPHR with 13 minutes of exercise.  15.1 METS.  Indicated normal response to exercise.  Interval History:   Ibn Stief presents here today for reestablishing cardiology care.  He says his A-fib has been pretty well-controlled on the combination of flecainide and Toprol.  He initially had some bradycardic issues with Toprol but now he seems to be doing better.  He may have a breakthrough spell lasting an hour or 2 maybe once a month if not once every 2 months.  No associated symptoms besides just the  irregular heartbeat.  No chest tightness pressure or dyspnea.  No CHF symptoms of PND, orthopnea or edema.  Send he notices that he has prolonged episodes of epigastric fullness and heaviness that can last several hours and can come and go through the course of the day.  Usually is improving where he takes his Pepcid but the probiotic is not really helping that much.  He denies any associated dyspnea or dizziness associated with this symptom just a little bit of queasiness.  Usually better after taking his Pepcid.  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  Denies any exertional chest tightness or pressure however he does note that he gets this fullness and heaviness sensation in his epigastric area most days during the late afternoon which is oftentimes associated when he is finished doing quite a bit of exercise.  He does not have it during exercise.  He says at home he walks on his treadmill at an incline and does pretty well.  Prior to being on beta-blocker and flecainide, he was able to go least 15 to 20 minutes without getting his heart rate up into a target zone but was able to his heart rate easily into the 130s and 140s, however now he cannot get his heart rate above 110 without much.  CV Review of Symptoms (Summary): Cardiovascular ROS: no chest pain or dyspnea on exertion positive for - chest pain, irregular heartbeat, and reviewed above.  Short bursts of A-fib, morbid epigastric discomfort. negative for - dyspnea on exertion, edema, orthopnea, paroxysmal nocturnal dyspnea, rapid heart rate, shortness of breath, or syncope or near syncope, TIA/Numbers fevers or claudication.  REVIEWED OF SYSTEMS   Review of Systems  Constitutional:  Negative for malaise/fatigue and weight loss.  HENT:  Negative for congestion.   Respiratory:  Negative for cough.   Cardiovascular:  Positive for chest pain (Lower  central chest/epigastric comfort).  Gastrointestinal:  Positive for abdominal pain and heartburn.  Genitourinary:  Negative for frequency and hematuria.  Musculoskeletal:  Negative for joint pain and myalgias.  Neurological:  Negative for dizziness, focal weakness and weakness.  Psychiatric/Behavioral: Negative.     I have reviewed and (if needed) personally updated the patient's problem list, medications, allergies, past medical and surgical history, social and family history.   PAST MEDICAL HISTORY   Past Medical History:  Diagnosis Date   Atrial fibrillation (HCC)    Gout    Hyperlipidemia     PAST SURGICAL HISTORY   Past Surgical History:  Procedure Laterality Date   INGUINAL HERNIA REPAIR Bilateral    SEPTOPLASTY     SHOULDER ARTHROSCOPY     TONSILLECTOMY      MEDICATIONS/ALLERGIES   Current Meds  Medication Sig   allopurinol (ZYLOPRIM) 300 MG tablet Take 1 tablet by mouth daily. Take 1 tab  daily   aspirin 81 MG tablet Take 81 mg by mouth daily.   atorvastatin (LIPITOR) 10 MG tablet Take 1 tablet by mouth daily. Take 1 tab daily   flecainide (TAMBOCOR) 100 MG tablet TAKE 1 TABLET (100 MG TOTAL) BY MOUTH 2 (TWO) TIMES DAILY. NEED OV.   metoprolol succinate (TOPROL-XL) 25 MG 24 hr tablet Take 1 tablet (25 mg total) by mouth daily.   NON FORMULARY Take 25 mg by mouth daily. CBD tablet   Probiotic Product (PROBIOTIC DAILY PO) Take 1 tablet by mouth daily.   tadalafil (CIALIS) 5 MG tablet Take 5 mg by mouth daily as needed for erectile dysfunction.    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     Family History  Problem Relation Age of Onset    Hypertension Mother    Arrhythmia Father        A-fib   Diabetes Father    CAD Father 54       CABG, arrhythmia with ablation (no details)    Hypertension Father    Arrhythmia Brother        A-Fib   Hypertension Brother    Arrhythmia Paternal Aunt        A-fib   Diabetes Maternal Grandmother    Hypertension Maternal Grandmother    Diabetes Maternal Grandfather    Heart attack Maternal Grandfather    Hypertension Maternal Grandfather    Fainting Maternal Grandfather    Diabetes Paternal Grandmother    Hypertension Paternal Grandmother    Fainting Paternal Grandmother    Diabetes Paternal Grandfather    Heart attack Paternal Grandfather    Hypertension Paternal Grandfather    Colon cancer Neg Hx    Esophageal cancer Neg Hx    Rectal cancer Neg Hx    Stomach cancer Neg Hx     OBJCTIVE -PE, EKG, labs   Wt Readings from Last 3 Encounters:  08/23/22 240 lb 8 oz (109.1 kg)  03/07/21 240 lb (108.9 kg)  01/31/21 232 lb (105.2 kg)    Physical Exam: BP 128/80 (BP Location: Right Arm, Patient Position: Sitting, Cuff Size: Normal)   Pulse (!) 55   Ht 6\' 4"  (1.93 m)   Wt 240 lb 8 oz (109.1 kg)   SpO2 96%   BMI 29.27 kg/m  Physical Exam Vitals reviewed.  Constitutional:      General: He is not in acute distress.    Appearance: Normal appearance. He is normal weight. He is not ill-appearing or toxic-appearing.  HENT:     Head: Normocephalic and atraumatic.  Neck:     Vascular: No carotid bruit.  Cardiovascular:     Rate and Rhythm: Normal rate.     Chest Wall: PMI is not displaced.     Pulses: Normal pulses.     Heart sounds: Normal heart sounds, S1 normal and S2 normal. No murmur heard.    No friction rub. No gallop.  Pulmonary:     Effort: No respiratory distress.     Breath sounds: Normal breath sounds. No wheezing, rhonchi or rales.  Chest:  Chest wall: No tenderness.  Abdominal:     General: Abdomen is flat. Bowel sounds are normal. There is no distension.      Palpations: Abdomen is soft.     Tenderness: There is no abdominal tenderness. There is no right CVA tenderness, left CVA tenderness, guarding or rebound.  Musculoskeletal:        General: No swelling. Normal range of motion.     Cervical back: Normal range of motion and neck supple.  Skin:    General: Skin is warm and dry.  Neurological:     General: No focal deficit present.     Mental Status: He is alert and oriented to person, place, and time.     Gait: Gait normal.  Psychiatric:        Mood and Affect: Mood normal.        Behavior: Behavior normal.        Thought Content: Thought content normal.        Judgment: Judgment normal.      Adult ECG Report  Rate: 55 ;  Rhythm: sinus bradycardia and 1  AVB ;   Narrative Interpretation: Otherwise normal  Recent Labs: Reviewed No results found for: "CHOL", "HDL", "LDLCALC", "LDLDIRECT", "TRIG", "CHOLHDL" Lab Results  Component Value Date   CREATININE 0.84 02/11/2015   BUN 20 02/11/2015   NA 139 02/11/2015   K 3.7 02/11/2015   CL 106 02/11/2015   CO2 26 02/11/2015      Latest Ref Rng & Units 02/11/2015    1:05 AM  CBC  WBC 4.0 - 10.5 K/uL 6.9   Hemoglobin 13.0 - 17.0 g/dL 16.1   Hematocrit 09.6 - 52.0 % 44.9   Platelets 150 - 400 K/uL 172     No results found for: "HGBA1C" Lab Results  Component Value Date   TSH 1.100 02/15/2015    ================================================== I spent a total of 25 minutes with the patient spent in direct patient consultation.  Additional time spent with chart review  / charting (studies, outside notes, etc): 18 min Total Time: 43 min  Current medicines are reviewed at length with the patient today.  (+/- concerns) N/A  Notice: This dictation was prepared with Dragon dictation along with smart phrase technology. Any transcriptional errors that result from this process are unintentional and may not be corrected upon review.   Studies Ordered:  Orders Placed This  Encounter  Procedures   EKG 12-Lead   No orders of the defined types were placed in this encounter.   Patient Instructions / Medication Changes & Studies & Tests Ordered   Patient Instructions  Medication Instructions:  No changes at this time.   *If you need a refill on your cardiac medications before your next appointment, please call your pharmacy*   Lab Work: None  If you have labs (blood work) drawn today and your tests are completely normal, you will receive your results only by: MyChart Message (if you have MyChart) OR A paper copy in the mail If you have any lab test that is abnormal or we need to change your treatment, we will call you to review the results.   Testing/Procedures: None   Follow-Up: At [facility], you and your health needs are our priority.  As part of our continuing mission to provide you with exceptional heart care, we have created designated Provider Care Teams.  These Care Teams include your primary Cardiologist (physician) and Advanced Practice Providers (APPs -  Physician Assistants and  Nurse Practitioners) who all work together to provide you with the care you need, when you need it.  We recommend signing up for the patient portal called "MyChart".  Sign up information is provided on this After Visit Summary.  MyChart is used to connect with patients for Virtual Visits (Telemedicine).  Patients are able to view lab/test results, encounter notes, upcoming appointments, etc.  Non-urgent messages can be sent to your provider as well.   To learn more about what you can do with MyChart, go to [email or web address].    Your next appointment:   4 month(s)  Provider:   You may see Dr. Herbie Baltimore or one of the following Advanced Practice Providers on your designated Care Team:   Nicolasa Ducking, NP Eula Listen, PA-C Cadence Fransico Michael, PA-C Charlsie Quest, NP     Marykay Lex, MD, MS Bryan Lemma, M.D., M.S. Interventional Cardiologist   [facility]   [address]; [address] [place], [place]  [address] [phone]           Fax [phone]    Thank you for choosing [facility] in [place]!!

## 2022-10-19 DIAGNOSIS — Z23 Encounter for immunization: Secondary | ICD-10-CM | POA: Diagnosis not present

## 2022-10-19 DIAGNOSIS — Z7184 Encounter for health counseling related to travel: Secondary | ICD-10-CM | POA: Diagnosis not present

## 2023-01-01 DIAGNOSIS — L814 Other melanin hyperpigmentation: Secondary | ICD-10-CM | POA: Diagnosis not present

## 2023-01-01 DIAGNOSIS — D1801 Hemangioma of skin and subcutaneous tissue: Secondary | ICD-10-CM | POA: Diagnosis not present

## 2023-01-01 DIAGNOSIS — Z85828 Personal history of other malignant neoplasm of skin: Secondary | ICD-10-CM | POA: Diagnosis not present

## 2023-01-03 ENCOUNTER — Ambulatory Visit: Payer: BC Managed Care – PPO | Admitting: Cardiology

## 2023-01-07 ENCOUNTER — Encounter: Payer: Self-pay | Admitting: Internal Medicine

## 2023-02-13 ENCOUNTER — Encounter: Payer: Self-pay | Admitting: Family Medicine

## 2023-02-13 ENCOUNTER — Other Ambulatory Visit: Payer: Self-pay

## 2023-02-13 ENCOUNTER — Ambulatory Visit (INDEPENDENT_AMBULATORY_CARE_PROVIDER_SITE_OTHER): Payer: BC Managed Care – PPO | Admitting: Family Medicine

## 2023-02-13 ENCOUNTER — Other Ambulatory Visit (HOSPITAL_BASED_OUTPATIENT_CLINIC_OR_DEPARTMENT_OTHER): Payer: Self-pay

## 2023-02-13 VITALS — BP 118/80 | Ht 76.0 in | Wt 240.0 lb

## 2023-02-13 DIAGNOSIS — M722 Plantar fascial fibromatosis: Secondary | ICD-10-CM | POA: Diagnosis not present

## 2023-02-13 DIAGNOSIS — M25421 Effusion, right elbow: Secondary | ICD-10-CM

## 2023-02-13 DIAGNOSIS — M7021 Olecranon bursitis, right elbow: Secondary | ICD-10-CM | POA: Diagnosis not present

## 2023-02-13 MED ORDER — CELECOXIB 100 MG PO CAPS
100.0000 mg | ORAL_CAPSULE | Freq: Two times a day (BID) | ORAL | 0 refills | Status: AC
  Filled 2023-02-13 (×2): qty 60, 30d supply, fill #0

## 2023-02-13 NOTE — Progress Notes (Unsigned)
CHIEF COMPLAINT: No chief complaint on file.  _____________________________________________________________ SUBJECTIVE  HPI  Pt is a 62 y.o. 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, is always using it Doesn't frequently lean on his elbows Denies warmth or pain No Fevers, chills  Hx gout, takes medicine Hx rheumatoid arthritis, AI concerns: none  R heel pain Ongoing for 2-3 months ago, denies traumatic event at that time Went to good feet arch supports, using for 6 weeks, helping some but not much, offloading his feet Works on a farm, on his feet all the time Doesn't use heel inserts/cushions walking around the house Pain worse in the morning, described as a sharp pain Remedies include ice, cylindrical roller No numbness/tingling   ------------------------------------------------------------------------------------------------------ Past Medical History:  Diagnosis Date   Atrial fibrillation (HCC)    Gout    Hyperlipidemia     Past Surgical History:  Procedure Laterality Date   INGUINAL HERNIA REPAIR Bilateral    SEPTOPLASTY     SHOULDER ARTHROSCOPY     TONSILLECTOMY        Outpatient Encounter Medications as of 02/13/2023  Medication Sig Note   allopurinol (ZYLOPRIM) 300 MG tablet Take 1 tablet by mouth daily. Take 1 tab daily 02/15/2015: Received from: External Pharmacy Received Sig:    aspirin 81 MG tablet Take 81 mg by mouth daily.    atorvastatin (LIPITOR) 10 MG tablet Take 1 tablet by mouth daily. Take 1 tab daily 02/15/2015: Received from: External Pharmacy Received Sig:    COVID-19 mRNA bivalent vaccine, Pfizer, (PFIZER COVID-19 VAC BIVALENT) injection Inject into the muscle. (Patient not taking: Reported on 08/23/2022)    flecainide (TAMBOCOR) 100 MG tablet TAKE 1 TABLET (100 MG TOTAL) BY MOUTH 2 (TWO) TIMES DAILY. NEED OV.    ibuprofen (ADVIL,MOTRIN) 800 MG tablet Take 1  tablet (800 mg total) by mouth every 8 (eight) hours as needed. (Patient not taking: Reported on 08/23/2022)    influenza vac split quadrivalent PF (FLUARIX QUADRIVALENT) 0.5 ML injection Inject into the muscle. (Patient not taking: Reported on 08/23/2022)    metoprolol succinate (TOPROL-XL) 25 MG 24 hr tablet Take 1 tablet (25 mg total) by mouth daily.    NON FORMULARY Take 25 mg by mouth daily. CBD tablet    Probiotic Product (PROBIOTIC DAILY PO) Take 1 tablet by mouth daily.    tadalafil (CIALIS) 5 MG tablet Take 5 mg by mouth daily as needed for erectile dysfunction.    No facility-administered encounter medications on file as of 02/13/2023.    ------------------------------------------------------------------------------------------------------  _____________________________________________________________ OBJECTIVE  PHYSICAL EXAM  There were no vitals filed for this visit. There is no height or weight on file to calculate BMI.   reviewed  General: A+Ox3, no acute distress, well-nourished, appropriate affect CV: pulses 2+ regular, nondiaphoretic, no peripheral edema, cap refill <2sec Lungs: no audible wheezing, non-labored breathing, bilateral chest rise/fall, nontachypneic Skin: warm, well-perfused, non-icteric, no susp lesions or rashes Neuro: CN II-XII intact bilaterally. Sensation intact, muscle tone wnl, no atrophy Psych: no signs of depression or anxiety MSK: ***      _____________________________________________________________ ASSESSMENT/PLAN There are no diagnoses linked to this encounter.  >>pocus for fluid if c/f infection >>XR foot, XR elbow    - fleet feet/fitting  No follow-ups on file.  Electronically signed by: Burna Forts, MD 02/13/2023 8:35 AM

## 2023-03-04 ENCOUNTER — Ambulatory Visit (AMBULATORY_SURGERY_CENTER): Payer: BC Managed Care – PPO | Admitting: *Deleted

## 2023-03-04 VITALS — Ht 76.0 in | Wt 240.0 lb

## 2023-03-04 DIAGNOSIS — Z860100 Personal history of colon polyps, unspecified: Secondary | ICD-10-CM

## 2023-03-04 MED ORDER — NA SULFATE-K SULFATE-MG SULF 17.5-3.13-1.6 GM/177ML PO SOLN: 1.0000 | Freq: Once | ORAL | 0 refills | Status: AC

## 2023-03-04 NOTE — Progress Notes (Signed)
Pt's name and DOB verified at the beginning of the pre-visit wit 2 identifiers  Pt denies any difficulty with ambulating,sitting, laying down or rolling side to side  Pt has no issues with ambulation   Pt has no issues moving head neck or swallowing  No egg or soy allergy known to patient   No issues known to pt with past sedation with any surgeries or procedures  Pt denies having issues being intubated  No FH of Malignant Hyperthermia  Pt is not on diet pills or shots  Pt is not on home 02   Pt is not on blood thinners   Pt denies issues with constipation  Pt is not on dialysis  Has hx of Afib  Pt denies any upcoming cardiac testing  Pt encouraged to use to use Singlecare or Goodrx to reduce cost   Patient's chart reviewed by Cathlyn Parsons CNRA prior to pre-visit and patient appropriate for the [facility].  Pre-visit completed and red dot placed by patient's name on their procedure day (on provider's schedule).  .  Visit by phone  Pt states weight is 240 lb  Instructed pt why it is important to and  to call if they have any changes in health or new medications. Directed them to the # given and on instructions.     Instructions reviewed. Pt given both [facility] main # and MD on call # prior to instructions.  Pt states understanding. Instructed to review again prior to procedure. Pt states they will.   Instructions sent by mail with coupon and by My Chart   Coupon sent via text to mobile phone and pt verified they received it

## 2023-04-02 ENCOUNTER — Encounter: Payer: Self-pay | Admitting: Internal Medicine

## 2023-04-05 ENCOUNTER — Encounter: Payer: Self-pay | Admitting: Internal Medicine

## 2023-04-05 ENCOUNTER — Ambulatory Visit (AMBULATORY_SURGERY_CENTER): Payer: BC Managed Care – PPO | Admitting: Internal Medicine

## 2023-04-05 VITALS — BP 102/60 | HR 58 | Temp 97.3°F | Resp 18 | Ht 76.0 in | Wt 240.0 lb

## 2023-04-05 DIAGNOSIS — Z860100 Personal history of colon polyps, unspecified: Secondary | ICD-10-CM

## 2023-04-05 DIAGNOSIS — Z1211 Encounter for screening for malignant neoplasm of colon: Secondary | ICD-10-CM | POA: Diagnosis not present

## 2023-04-05 DIAGNOSIS — D122 Benign neoplasm of ascending colon: Secondary | ICD-10-CM | POA: Diagnosis not present

## 2023-04-05 DIAGNOSIS — K5730 Diverticulosis of large intestine without perforation or abscess without bleeding: Secondary | ICD-10-CM

## 2023-04-05 MED ORDER — SODIUM CHLORIDE 0.9 % IV SOLN: 500.0000 mL | Freq: Once | INTRAVENOUS | Status: DC

## 2023-04-05 NOTE — Patient Instructions (Signed)
Resume previous diet and medications. Awaiting pathology results. Repeat Colonoscopy date to be determined based on pathology results.  Handouts provided on Colon polyps and Diverticulosis.  YOU HAD AN ENDOSCOPIC PROCEDURE TODAY AT THE [facility]:   Refer to the procedure report that was given to you for any specific questions about what was found during the examination.  If the procedure report does not answer your questions, please call your gastroenterologist to clarify.  If you requested that your care partner not be given the details of your procedure findings, then the procedure report has been included in a sealed envelope for you to review at your convenience later.  YOU SHOULD EXPECT: Some feelings of bloating in the abdomen. Passage of more gas than usual.  Walking can help get rid of the air that was put into your GI tract during the procedure and reduce the bloating. If you had a lower endoscopy (such as a colonoscopy or flexible sigmoidoscopy) you may notice spotting of blood in your stool or on the toilet paper. If you underwent a bowel prep for your procedure, you may not have a normal bowel movement for a few days.  Please Note:  You might notice some irritation and congestion in your nose or some drainage.  This is from the oxygen used during your procedure.  There is no need for concern and it should clear up in a day or so.  SYMPTOMS TO REPORT IMMEDIATELY:  Following lower endoscopy (colonoscopy or flexible sigmoidoscopy):  Excessive amounts of blood in the stool  Significant tenderness or worsening of abdominal pains  Swelling of the abdomen that is new, acute  Fever of 100F or higher  For urgent or emergent issues, a gastroenterologist can be reached at any hour by calling [phone]. Do not use MyChart messaging for urgent concerns.    DIET:  We do recommend a small meal at first, but then you may proceed to your regular diet.  Drink plenty of fluids but  you should avoid alcoholic beverages for 24 hours.  ACTIVITY:  You should plan to take it easy for the rest of today and you should NOT DRIVE or use heavy machinery until tomorrow (because of the sedation medicines used during the test).    FOLLOW UP: Our staff will call the number listed on your records the next business day following your procedure.  We will call around 7:15- 8:00 am to check on you and address any questions or concerns that you may have regarding the information given to you following your procedure. If we do not reach you, we will leave a message.     If any biopsies were taken you will be contacted by phone or by letter within the next 1-3 weeks.  Please call us at [phone] if you have not heard about the biopsies in 3 weeks.    SIGNATURES/CONFIDENTIALITY: You and/or your care partner have signed paperwork which will be entered into your electronic medical record.  These signatures attest to the fact that that the information above on your After Visit Summary has been reviewed and is understood.  Full responsibility of the confidentiality of this discharge information lies with you and/or your care-partner.

## 2023-04-05 NOTE — Progress Notes (Signed)
Called to room to assist during endoscopic procedure.  Patient ID and intended procedure confirmed with present staff. Received instructions for my participation in the procedure from the performing physician.

## 2023-04-05 NOTE — Progress Notes (Signed)
Report to PACU, RN, vss, BBS= Clear.

## 2023-04-05 NOTE — Op Note (Signed)
[facility] Patient Name: John Hancock Procedure Date: 04/05/2023 8:35 AM MRN: [id number] Endoscopist: Beverley Fiedler , MD, [id number] Age: 63 Referring MD:  Date of Birth: 1960-10-16 Gender: Male Account #: [id number] Procedure:                Colonoscopy Indications:              High risk colon cancer surveillance: Personal                            history of sessile serrated colon polyp (less than                            10 mm in size) with no dysplasia, Last colonoscopy:                            September 2019 (SSP x 2, TA x 2) Medicines:                Monitored Anesthesia Care Procedure:                Pre-Anesthesia Assessment:                           - Prior to the procedure, a History and Physical                            was performed, and patient medications and                            allergies were reviewed. The patient's tolerance of                            previous anesthesia was also reviewed. The risks                            and benefits of the procedure and the sedation                            options and risks were discussed with the patient.                            All questions were answered, and informed consent                            was obtained. Prior Anticoagulants: The patient has                            taken no anticoagulant or antiplatelet agents. ASA                            Grade Assessment: II - A patient with mild systemic                            disease. After reviewing the risks and benefits,  the patient was deemed in satisfactory condition to                            undergo the procedure.                           After obtaining informed consent, the colonoscope                            was passed under direct vision. Throughout the                            procedure, the patient's blood pressure, pulse, and                            oxygen saturations were  monitored continuously. The                            Colonoscope was introduced through the anus and                            advanced to the cecum, identified by appendiceal                            orifice and ileocecal valve. The colonoscopy was                            performed without difficulty. The patient tolerated                            the procedure well. The quality of the bowel                            preparation was good. The ileocecal valve,                            appendiceal orifice, and rectum were photographed. Scope In: 8:39:59 AM Scope Out: 8:58:16 AM Scope Withdrawal Time: 0 hours 12 minutes 30 seconds  Total Procedure Duration: 0 hours 18 minutes 17 seconds  Findings:                 The digital rectal exam was normal.                           A 7 mm polyp was found in the ascending colon. The                            polyp was sessile. The polyp was removed with a                            cold snare. Resection and retrieval were complete.                           A 5 mm polyp was found in the  ascending colon. The                            polyp was sessile. The polyp was removed with a                            cold snare. Resection and retrieval were complete.                           A few small-mouthed diverticula were found in the                            sigmoid colon.                           The exam was otherwise without abnormality on                            direct and retroflexion views. Complications:            No immediate complications. Estimated Blood Loss:     Estimated blood loss was minimal. Impression:               - One 7 mm polyp in the ascending colon, removed                            with a cold snare. Resected and retrieved.                           - One 5 mm polyp in the ascending colon, removed                            with a cold snare. Resected and retrieved.                           - Mild  diverticulosis in the sigmoid colon.                           - The examination was otherwise normal on direct                            and retroflexion views. Recommendation:           - Patient has a contact number available for                            emergencies. The signs and symptoms of potential                            delayed complications were discussed with the                            patient. Return to normal activities tomorrow.  Written discharge instructions were provided to the                            patient.                           - Resume previous diet.                           - Continue present medications.                           - Await pathology results.                           - Repeat colonoscopy is recommended for                            surveillance. The colonoscopy date will be                            determined after pathology results from today's                            exam become available for review. Beverley Fiedler, MD 04/05/2023 9:00:47 AM This report has been signed electronically.

## 2023-04-05 NOTE — Progress Notes (Signed)
GASTROENTEROLOGY PROCEDURE H&P NOTE   Primary Care Physician: Gaspar Garbe, MD    Reason for Procedure:  History of colonic polyps  Plan:    Colonoscopy  Patient is appropriate for endoscopic procedure(s) in the ambulatory ([facility]) setting.  The nature of the procedure, as well as the risks, benefits, and alternatives were carefully and thoroughly reviewed with the patient. Ample time for discussion and questions allowed. The patient understood, was satisfied, and agreed to proceed.     HPI: John Hancock is a 63 y.o. male who presents for surveillance colonoscopy.  Medical history as below.  Tolerated the prep.  No recent chest pain or shortness of breath.  No abdominal pain today.  Past Medical History:  Diagnosis Date   Atrial fibrillation (HCC)    Gout    Hyperlipidemia     Past Surgical History:  Procedure Laterality Date   INGUINAL HERNIA REPAIR Bilateral    SEPTOPLASTY     SHOULDER ARTHROSCOPY     TONSILLECTOMY      Prior to Admission medications   Medication Sig Start Date End Date Taking? Authorizing Provider  allopurinol (ZYLOPRIM) 300 MG tablet Take 1 tablet by mouth daily. Take 1 tab daily 02/07/15  Yes [provider]  aspirin 81 MG tablet Take 81 mg by mouth daily.   Yes [provider]  atorvastatin (LIPITOR) 10 MG tablet Take 1 tablet by mouth daily. Take 1 tab daily 11/29/14  Yes [provider]  flecainide (TAMBOCOR) 100 MG tablet TAKE 1 TABLET (100 MG TOTAL) BY MOUTH 2 (TWO) TIMES DAILY. NEED OV. 11/16/16  Yes Rollene Rotunda, MD  metoprolol succinate (TOPROL-XL) 25 MG 24 hr tablet Take 1 tablet (25 mg total) by mouth daily. 06/03/15  Yes Rollene Rotunda, MD  ibuprofen (ADVIL,MOTRIN) 800 MG tablet Take 1 tablet (800 mg total) by mouth every 8 (eight) hours as needed. Patient not taking: Reported on 08/23/2022 05/08/17   Maia Plan, MD  influenza vac split quadrivalent PF (FLUARIX QUADRIVALENT) 0.5 ML injection Inject into  the muscle. 03/02/21   Judyann Munson, MD  NON FORMULARY Take 25 mg by mouth daily. CBD tablet    [provider]  Probiotic Product (PROBIOTIC DAILY PO) Take 1 tablet by mouth daily. Patient not taking: Reported on 04/05/2023    [provider]  tadalafil (CIALIS) 5 MG tablet Take 5 mg by mouth daily as needed for erectile dysfunction. Patient not taking: Reported on 04/05/2023    [provider]    Current Outpatient Medications  Medication Sig Dispense Refill   allopurinol (ZYLOPRIM) 300 MG tablet Take 1 tablet by mouth daily. Take 1 tab daily     aspirin 81 MG tablet Take 81 mg by mouth daily.     atorvastatin (LIPITOR) 10 MG tablet Take 1 tablet by mouth daily. Take 1 tab daily     flecainide (TAMBOCOR) 100 MG tablet TAKE 1 TABLET (100 MG TOTAL) BY MOUTH 2 (TWO) TIMES DAILY. NEED OV. 180 tablet 0   metoprolol succinate (TOPROL-XL) 25 MG 24 hr tablet Take 1 tablet (25 mg total) by mouth daily. 90 tablet 3   ibuprofen (ADVIL,MOTRIN) 800 MG tablet Take 1 tablet (800 mg total) by mouth every 8 (eight) hours as needed. (Patient not taking: Reported on 08/23/2022) 21 tablet 0   influenza vac split quadrivalent PF (FLUARIX QUADRIVALENT) 0.5 ML injection Inject into the muscle. 0.5 mL 0   NON FORMULARY Take 25 mg by mouth daily. CBD tablet  Probiotic Product (PROBIOTIC DAILY PO) Take 1 tablet by mouth daily. (Patient not taking: Reported on 04/05/2023)     tadalafil (CIALIS) 5 MG tablet Take 5 mg by mouth daily as needed for erectile dysfunction. (Patient not taking: Reported on 04/05/2023)     Current Facility-Administered Medications  Medication Dose Route Frequency Provider Last Rate Last Admin   0.9 %  sodium chloride infusion  500 mL Intravenous Once Karli Wickizer, Carie Caddy, MD        Allergies as of 04/05/2023   (No Known Allergies)    Family History  Problem Relation Age of Onset   Hypertension Mother    Arrhythmia Father        A-fib   Diabetes Father    CAD  Father 68       CABG, arrhythmia with ablation (no details)    Hypertension Father    Arrhythmia Brother        A-Fib   Hypertension Brother    Arrhythmia Paternal Aunt        A-fib   Diabetes Maternal Grandmother    Hypertension Maternal Grandmother    Diabetes Maternal Grandfather    Heart attack Maternal Grandfather    Hypertension Maternal Grandfather    Fainting Maternal Grandfather    Diabetes Paternal Grandmother    Hypertension Paternal Grandmother    Fainting Paternal Grandmother    Diabetes Paternal Grandfather    Heart attack Paternal Grandfather    Hypertension Paternal Grandfather    Colon cancer Neg Hx    Esophageal cancer Neg Hx    Rectal cancer Neg Hx    Stomach cancer Neg Hx    Colon polyps Neg Hx     Social History   Socioeconomic History   Marital status: Married    Spouse name: Not on file   Number of children: 2   Years of education: Not on file   Highest education level: Not on file  Occupational History   Not on file  Tobacco Use   Smoking status: Never   Smokeless tobacco: Never  Vaping Use   Vaping status: Never Used  Substance and Sexual Activity   Alcohol use: Yes    Comment: weekly   Drug use: No   Sexual activity: Not on file  Other Topics Concern   Not on file  Social History Narrative   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     Social Drivers of Corporate investment banker Strain: Not on file  Food Insecurity: Not on file  Transportation Needs: Not on file  Physical Activity: Not on file  Stress: Not on file  Social Connections: Unknown (07/18/2021)   Received from [facility], [facility]   Social Network    Social Network: Not on file  Intimate Partner Violence: Unknown (06/13/2021)   Received from [facility], [facility]   HITS    Physically Hurt: Not on file    Insult or Talk Down To: Not on file    Threaten Physical Harm: Not on file    Scream or Curse: Not on file    Physical Exam: Vital signs in last 24  hours: @BP  (!) 112/58   Pulse (!) 59   Temp (!) 97.3 F (36.3 C)   Ht 6\' 4"  (1.93 m)   Wt 240 lb (108.9 kg)   SpO2 97%   BMI 29.21 kg/m  GEN: NAD EYE: Sclerae anicteric ENT: MMM CV: Non-tachycardic Pulm: CTA b/l GI: Soft, NT/ND NEURO:  Alert &  Oriented x 3   Erick Blinks, MD [facility]  04/05/2023 8:33 AM

## 2023-04-08 ENCOUNTER — Telehealth: Payer: Self-pay

## 2023-04-08 NOTE — Telephone Encounter (Signed)
  Follow up Call-     04/05/2023    8:01 AM  Call back number  Post procedure Call Back phone  # [phone]  Permission to leave phone message Yes     Patient questions:  Do you have a fever, pain , or abdominal swelling? No. Pain Score  0 *  Have you tolerated food without any problems? Yes.    Have you been able to return to your normal activities? Yes.    Do you have any questions about your discharge instructions: Diet   No. Medications  No. Follow up visit  No.  Do you have questions or concerns about your Care? No.  Actions: * If pain score is 4 or above: No action needed, pain <4.

## 2023-04-09 ENCOUNTER — Encounter: Payer: Self-pay | Admitting: Internal Medicine

## 2023-04-09 LAB — SURGICAL PATHOLOGY
